# Patient Record
Sex: Female | Born: 1975
Health system: Southern US, Community
[De-identification: ages and names within clinical notes are randomized; demographics above are authoritative.]

## PROBLEM LIST (undated history)

## (undated) DIAGNOSIS — N6012 Diffuse cystic mastopathy of left breast: Secondary | ICD-10-CM

## (undated) DIAGNOSIS — N63 Unspecified lump in unspecified breast: Secondary | ICD-10-CM

## (undated) DIAGNOSIS — N6011 Diffuse cystic mastopathy of right breast: Secondary | ICD-10-CM

## (undated) HISTORY — DX: Diffuse cystic mastopathy of right breast: N60.12

## (undated) HISTORY — DX: Diffuse cystic mastopathy of right breast: N60.11

## (undated) HISTORY — PX: BREAST CYST ASPIRATION: SHX578

## (undated) HISTORY — DX: Unspecified lump in unspecified breast: N63.0

---

## 2005-02-02 ENCOUNTER — Observation Stay: Payer: Self-pay | Admitting: Obstetrics and Gynecology

## 2005-02-19 ENCOUNTER — Inpatient Hospital Stay: Payer: Self-pay

## 2005-02-26 ENCOUNTER — Observation Stay: Payer: Self-pay

## 2005-03-19 ENCOUNTER — Observation Stay: Payer: Self-pay | Admitting: Obstetrics and Gynecology

## 2005-04-23 ENCOUNTER — Observation Stay: Payer: Self-pay

## 2005-04-28 ENCOUNTER — Observation Stay: Payer: Self-pay

## 2005-05-04 ENCOUNTER — Inpatient Hospital Stay: Payer: Self-pay | Admitting: Obstetrics and Gynecology

## 2006-12-25 ENCOUNTER — Observation Stay: Payer: Self-pay

## 2007-06-26 ENCOUNTER — Emergency Department: Payer: Self-pay | Admitting: Emergency Medicine

## 2009-05-20 ENCOUNTER — Ambulatory Visit: Payer: Self-pay

## 2010-06-29 ENCOUNTER — Ambulatory Visit: Payer: Self-pay | Admitting: General Practice

## 2010-12-20 ENCOUNTER — Ambulatory Visit: Payer: Self-pay | Admitting: Unknown Physician Specialty

## 2011-03-06 DIAGNOSIS — N63 Unspecified lump in unspecified breast: Secondary | ICD-10-CM

## 2011-03-06 HISTORY — DX: Unspecified lump in unspecified breast: N63.0

## 2011-12-04 HISTORY — PX: BREAST BIOPSY: SHX20

## 2011-12-20 ENCOUNTER — Ambulatory Visit: Payer: Self-pay | Admitting: Obstetrics and Gynecology

## 2012-02-12 ENCOUNTER — Ambulatory Visit: Payer: Self-pay | Admitting: General Practice

## 2012-10-13 DIAGNOSIS — D239 Other benign neoplasm of skin, unspecified: Secondary | ICD-10-CM

## 2012-10-13 HISTORY — DX: Other benign neoplasm of skin, unspecified: D23.9

## 2013-02-09 ENCOUNTER — Ambulatory Visit: Payer: Self-pay | Admitting: Obstetrics and Gynecology

## 2013-06-17 ENCOUNTER — Encounter: Payer: Self-pay | Admitting: General Surgery

## 2013-07-09 ENCOUNTER — Ambulatory Visit (INDEPENDENT_AMBULATORY_CARE_PROVIDER_SITE_OTHER): Payer: BC Managed Care – PPO | Admitting: General Surgery

## 2013-07-09 ENCOUNTER — Encounter: Payer: Self-pay | Admitting: General Surgery

## 2013-07-09 ENCOUNTER — Other Ambulatory Visit: Payer: BC Managed Care – PPO

## 2013-07-09 VITALS — BP 116/70 | HR 74 | Resp 12 | Ht 71.0 in | Wt 141.0 lb

## 2013-07-09 DIAGNOSIS — N63 Unspecified lump in unspecified breast: Secondary | ICD-10-CM

## 2013-07-09 DIAGNOSIS — N6009 Solitary cyst of unspecified breast: Secondary | ICD-10-CM

## 2013-07-09 NOTE — Patient Instructions (Signed)
Continue self breast exams. Call office for any new breast issues or concerns. 

## 2013-07-09 NOTE — Progress Notes (Addendum)
Patient ID: Tonya Leonard, female   DOB: 08/07/1975, 38 y.o.   MRN: [id number]  Chief Complaint  Patient presents with  . Follow-up    Right breast nodule    HPI Tonya Leonard is a 38 y.o. female who presents for an evaluation of a right breast nodule. Her most recent mammogram was done in 02/09/13. She noticed a right breast nodule approximately 2 months ago that is below the previous biopsy site. It has not changed in size. No pain or discharge. She checks her breasts on a monthly basis and gets regular mammograms. The left breast just "feels different" but no identifiable mass.She has a family history of breast cancer on her maternal side.  Breast fed twins 2007 and she appreciated a significant decrease in her breast size since that time.  Because of the family history, her mother reportedly underwent genetic testing and was found not to be a BRCA carrier.  The patient's mother, [name], had brought an extended family tree to the office in 2012: This documented multiple generations of breast, ovarian, colon and esophageal cancer. Ms. Hightower mother had tested BRCA negative. Her mother is family history dating back to the maternal great-grandfather is positive only for breast cancer.  The patient's aunts:  [name] had breast cancer but was BRCA negative,  [name] was BRCA positive. Her maternal grandfather had bilateral breast cancer.     ]  HPI  Past Medical History  Diagnosis Date  . Breast mass 2013    right breast    Past Surgical History  Procedure Laterality Date  . Breast biopsy Right 12/2011    Family History  Problem Relation Age of Onset  . Cancer Maternal Aunt 60    breast  . Cancer Maternal Aunt 43    breast  . Cancer Maternal Grandfather 107    breast     Social History History  Substance Use Topics  . Smoking status: Never Smoker   . Smokeless tobacco: Not on file  . Alcohol Use: Yes    No Known Allergies  No current outpatient prescriptions  on file.   No current facility-administered medications for this visit.    Review of Systems Review of Systems  Constitutional: Negative.   Respiratory: Negative.   Cardiovascular: Negative.     Blood pressure 116/70, pulse 74, resp. rate 12, height _0  (1.803 m), weight 141 lb (63.957 kg), last menstrual period 06/25/2013.  Physical Exam Physical Exam  Constitutional: She is oriented to person, place, and time. She appears well-developed and well-nourished.  Eyes: Conjunctivae are normal.  Neck: Neck supple.  Cardiovascular: Normal rate, regular rhythm and normal heart sounds.   Pulmonary/Chest: Effort normal and breath sounds normal. Right breast exhibits mass. Right breast exhibits no inverted nipple, no nipple discharge, no skin change and no tenderness. Left breast exhibits no inverted nipple, no nipple discharge, no skin change and no tenderness.    Right breast exam: 1 cm nodule 6 CFN right breast at the 1 o'clock position. Left breast exam: 2 o'clock addition, 8 CFN identified a 2 cm soft, nontender mass. At the 4 o'clock position, 6 CFN base bar a focal area of thickening was appreciated.   Lymphadenopathy:    She has no cervical adenopathy.  Neurological: She is alert and oriented to person, place, and time.  Skin: Skin is warm and dry.    Data Reviewed Bilateral mammograms dated 02/09/2013 showed extremely dense breast. No mammographic abnormality appreciated. BI-RAD-1. These films were  reviewed.  01/01/2012 biopsy completed due do an atypical finding in the right breast showed evidence of benign breast tissue with changes of a ruptured cyst. Pseudo-angiomatous stromal hyperplasia with sclerosing adenosis was appreciated. No evidence of atypia or malignancy.  With palpable findings in both breasts bilateral ultrasound was completed.  Left breast ultrasound showed a dominant mass measuring 1.6 x 3.3 x 3.9 cm at the 2:00 position, 6 cm from the nipple. Smooth  margins, posterior acoustic enhancement and Brownian motion was noted consistent with a simple cyst. Adjacent to this were several "daughter cysts" measuring 0.7 and 1.0 cm respectively. At the 12:00 position the left breast, 5 cm from the nipple a softly lobulated cystic lesion with smooth margins was identified measuring 0.8 x 1.8 x 2.46 cm. This rested on the pectoralis fashion making assessment for posterior acoustic enhancement difficult. At the 4:00 position of the left breast to 0.4 x 0.4 x 0.5 cm simple cyst was identified 6 cm from the nipple. At the 6:00 position of the left breast, 6 cm from the nipple multiple cysts were identified measuring up to 0.9 cm in diameter.  In the right breast multiple lesions were identified. At the 4:00 position, 6 cm in level is 0.7 x 1.0 x 1.13 cm simple cyst was identified. At the 10:00 position a 0.75 x 0.86 x 0.95 cm cyst was noted with several adjacent smaller lesions. At the 1:00 position 3 cm from the nipple a 0.9 x 1.6 x 1.86 cm simple cyst was noted. All of these lesion showed good posterior acoustic enhancement, smooth borders and sharp edge affect. BI-RAD-2.  Assessment    Multiple breast cysts.    Plan    Options for management of multiple breast cysts were discussed. The patient may or may not be sensitive to caffeine. Caffeine cessation may improve her overall breast texture. She is not having significant pain, making it likely that the caffeine withdrawal may be more aggravating than the multiple cysts on her clinical exam. The potential role for antioxidant vitamins such as Protegra and Ocuvite were discussed.  The patient's Baker Janus model breast cancer risk is estimated is 1.0% for 5 years, 14.8% lifetime.  IBIS model shows a 10 year risk of 2.5%, lifetime risk of 20%.  (Neither model takes into account the paternal grandfather's history). In light of the patient's mother being BRCA tested and having a negative result reported, I don't see strong  indication for the patient herself to undergo BRCA testing. By Ronelle Nigh testing she is not a candidate for breast MRI, and even making use of the [place], she barely crosses the threshold. I would not recommend breast MRI at this time.    With the identification of only simple cysts in the breast, the patient has been encouraged to continue monthly self-examination and to report any interval change for reassessment.       PCP: Tamera Stands Mell Mellott 07/12/2013, 12:17 PM

## 2013-07-12 DIAGNOSIS — N6009 Solitary cyst of unspecified breast: Secondary | ICD-10-CM | POA: Insufficient documentation

## 2013-07-12 DIAGNOSIS — N63 Unspecified lump in unspecified breast: Secondary | ICD-10-CM | POA: Insufficient documentation

## 2013-07-14 ENCOUNTER — Telehealth: Payer: Self-pay | Admitting: *Deleted

## 2013-07-14 NOTE — Telephone Encounter (Signed)
duplicate

## 2013-07-14 NOTE — Telephone Encounter (Signed)
Notified patient as instructed, patient pleased. Discussed follow-up appointments, patient agrees. She has stopped caffeine intake to see if that makes a difference, headaches discussed. She will continue to do self breast examinations and call if new issues rise.

## 2013-07-14 NOTE — Telephone Encounter (Signed)
Message copied by Carson Myrtle on Tue Jul 14, 2013  8:28 AM ------      Message from: [place], [place]      Created: Sun Jul 12, 2013 12:47 PM       Please notify the patient look through her mother's family pedigree, and at this time don't find any additional testing as appropriate. Reassure her that should she appreciate any future changes on her breasts she be welcome to return for reassessment. ------

## 2013-07-17 ENCOUNTER — Encounter: Payer: Self-pay | Admitting: General Surgery

## 2013-10-07 ENCOUNTER — Ambulatory Visit: Payer: Self-pay | Admitting: General Surgery

## 2013-11-16 ENCOUNTER — Telehealth: Payer: Self-pay

## 2013-11-16 NOTE — Telephone Encounter (Signed)
Patient called stating that she had found a new area on her left breast. She states that she normally has multiple cysts but this area feels larger. She states that she just started her menses and that the area is not painful. She also states that it seems like it may have moved a little bit from when she first noticed it. She is concerned and would like to speak with someone further about this. She is scheduled for a follow up her on 11/25/13 but is not sure if this is necessary.

## 2013-11-17 NOTE — Telephone Encounter (Signed)
She denies pain, "ping ball" in size. She states it "feels different". Heating pad as needed. She did start drinking more caffeine but has stopped it again. I encouraged her to keep her appointment for next week, not to try not manipulate the area too much, she agrees.

## 2013-11-25 ENCOUNTER — Encounter: Payer: Self-pay | Admitting: General Surgery

## 2013-11-25 ENCOUNTER — Ambulatory Visit (INDEPENDENT_AMBULATORY_CARE_PROVIDER_SITE_OTHER): Payer: BC Managed Care – PPO | Admitting: General Surgery

## 2013-11-25 ENCOUNTER — Other Ambulatory Visit: Payer: BC Managed Care – PPO

## 2013-11-25 VITALS — BP 118/76 | HR 82 | Resp 12 | Ht 71.0 in | Wt 126.0 lb

## 2013-11-25 DIAGNOSIS — N6009 Solitary cyst of unspecified breast: Secondary | ICD-10-CM

## 2013-11-25 DIAGNOSIS — N6002 Solitary cyst of left breast: Secondary | ICD-10-CM

## 2013-11-25 DIAGNOSIS — N63 Unspecified lump in unspecified breast: Secondary | ICD-10-CM

## 2013-11-25 NOTE — Patient Instructions (Signed)
Continue self breast exams. Call office for any new breast issues or concerns. 

## 2013-11-25 NOTE — Progress Notes (Signed)
Patient ID: Tonya Leonard, female   DOB: 1976-02-15, 38 y.o.   MRN: [id number]  Chief Complaint  Patient presents with  . Other    new left breast mass    HPI Tonya Leonard is a 38 y.o. female here today for a evaluation of a new left breast mass. Patient noticed this about 2 weeks ago. She denies pain, "ping ball" in size. She states it "feels different".  She did start drinking more caffeine but has stopped it again. She does state that her menstrual periods seem to be getting worse each year.   HPI  Past Medical History  Diagnosis Date  . Breast mass 2013    right breast    Past Surgical History  Procedure Laterality Date  . Breast biopsy Right 12/2011    Family History  Problem Relation Age of Onset  . Cancer Maternal Aunt 60    breast  . Cancer Maternal Aunt 43    breast  . Cancer Maternal Grandfather 85    breast     Social History History  Substance Use Topics  . Smoking status: Never Smoker   . Smokeless tobacco: Not on file  . Alcohol Use: Yes    No Known Allergies  No current outpatient prescriptions on file.   No current facility-administered medications for this visit.    Review of Systems Review of Systems  Constitutional: Negative.   Respiratory: Negative.   Cardiovascular: Negative.     Blood pressure 118/76, pulse 82, resp. rate 12, height 5\' 11"  (1.803 m), weight 126 lb (57.153 kg), last menstrual period 11/19/2013.  Physical Exam Physical Exam  Constitutional: She is oriented to person, place, and time. She appears well-developed and well-nourished.  Neck: Neck supple.  Cardiovascular: Normal rate, regular rhythm and normal heart sounds.   Pulmonary/Chest: Effort normal and breath sounds normal. Right breast exhibits mass. Right breast exhibits no inverted nipple, no nipple discharge, no skin change and no tenderness. Left breast exhibits mass. Left breast exhibits no inverted nipple, no nipple discharge, no skin change and no  tenderness.    Right breast exam again shows a 1 cm nodule at the 4:00 position, 5 cm from the nipple.   Left breast exam shows a dominant mass in the upper outer quadrant the 2:00 position. This measures 3-4 cm in diameter.  Lymphadenopathy:    She has no cervical adenopathy.    She has no axillary adenopathy.  Neurological: She is alert and oriented to person, place, and time.  Skin: Skin is warm and dry.    Data Reviewed The may 2015 ultrasound showed a nearly 4 cm cyst in the upper-outer quadrant of the left breast.  Ultrasound examination today shows the dominant cyst with evidence of Brownian motion within the cyst fluid. Good acoustic enhancement was noted. The area measured  2.14 x 2.5 x 3.23 cm. Immediately adjacent to this is a smaller 0.9 x 0.9 cm simple cyst. Due to the discomfort the patient has experienced in the last 2 weeks, aspiration was encouraged and accepted. Using 1 cc of 1% plain Xylocaine the area was aspirated with complete resolution. 10 cc of air was then instilled to minimize the risk of recurrence. The procedure was well tolerated. A palpable defect was notable after cyst aspiration. BI-RAD-2.  Assessment    Symptomatic left breast cyst, resolved on aspiration.    Plan    The patient was incredibly anxious throughout today's exam until the cyst was aspirated. With complete  resolution on aspiration and comfortable that a repeat clinical exam is not mandatory. This was offered to the patient however in the event that she would feel more comfortable. At this time, she'll continue regular self examinations and report if she appreciates any change in the breast. The small asymptomatic 1 cm cyst in the right breast did not require intervention. (Previous ultrasound confirmed this to be a simple cyst)    PCP: Jayme Cloud 11/26/2013, 4:14 PM

## 2014-01-04 ENCOUNTER — Encounter: Payer: Self-pay | Admitting: General Surgery

## 2014-01-05 LAB — TSH: TSH: 1.59 u[IU]/mL (ref <=5.90)

## 2014-01-05 LAB — LIPID PANEL
CHOLESTEROL: 174 mg/dL (ref 0–200)
HDL: 56 mg/dL (ref 35–70)
LDL Cholesterol: 107 mg/dL
Triglycerides: 54 mg/dL (ref 40–160)

## 2014-01-05 LAB — BASIC METABOLIC PANEL: GLUCOSE: 94 mg/dL

## 2014-01-15 LAB — HM PAP SMEAR: HM PAP: NEGATIVE

## 2014-05-27 ENCOUNTER — Ambulatory Visit: Payer: Self-pay | Admitting: Obstetrics and Gynecology

## 2014-06-02 ENCOUNTER — Telehealth: Payer: Self-pay

## 2014-06-02 NOTE — Telephone Encounter (Signed)
Patient called stating that she just had her screening mammogram done at [facility] on 05/20/14. She said that they found an area on her right breast and she would like for you to review her mammogram and see if she needs to be see or not or if she should have further imaging done. She states that she is not having any problems with her breast but does have a long history of breast cysts.

## 2014-06-04 NOTE — Telephone Encounter (Signed)
Patient's recent mammogram suggested an asymmetry in the lower inner quadrant of the right breast. The patient was identified with a cyst in this area at the time of her spring 2015 exam. Ultrasound would be appropriate to confirm that this is indeed the same lesion. The patient can have this completed through her primary care provider and [facility] or can return here for assessment. She was encouraged to call the office on 06/07/2014 (Monday) if we can be of assistance.

## 2014-06-05 ENCOUNTER — Telehealth: Payer: Self-pay | Admitting: General Surgery

## 2014-06-05 NOTE — Telephone Encounter (Signed)
The patient called the office on 06/02/2014 requesting that I review her most recent mammograms as a asymmetry was identified on screening study of March 24. The patient reports being told by the [facility]'s staff/M.D. that they needed to "clear her breast" and that coming back to this office where she has been seen for over a year was "backwards".  I reviewed her previous exams and ultrasounds, and the density presently identified in the lower inner quadrant of the right breast is most likely a cyst.  She's been a contact the office on Monday, 06/07/2014 and make arrangements for visit and right breast ultrasound.

## 2014-06-15 ENCOUNTER — Ambulatory Visit
Admit: 2014-06-15 | Disposition: A | Discharge status: Home / Self Care | Payer: Self-pay | Attending: Obstetrics and Gynecology | Admitting: Obstetrics and Gynecology

## 2014-06-16 ENCOUNTER — Ambulatory Visit: Payer: Self-pay | Admitting: General Surgery

## 2014-09-08 IMAGING — US ULTRASOUND RIGHT BREAST
1 series · 13 of 25 positions shown · non-contrast
Comparison: 12/20/2010, 05/20/2009.

REASON FOR EXAM: RT BRST MAS 3 OCLOCK AND YRLY
COMMENTS:

PROCEDURE:     US  - US BREAST RIGHT  - December 20, 2011  [DATE]
RESULT:

[Series 1: ultrasound right breast · 0.08mm/px · 13 of 26 slices shown]
[im 1/26]
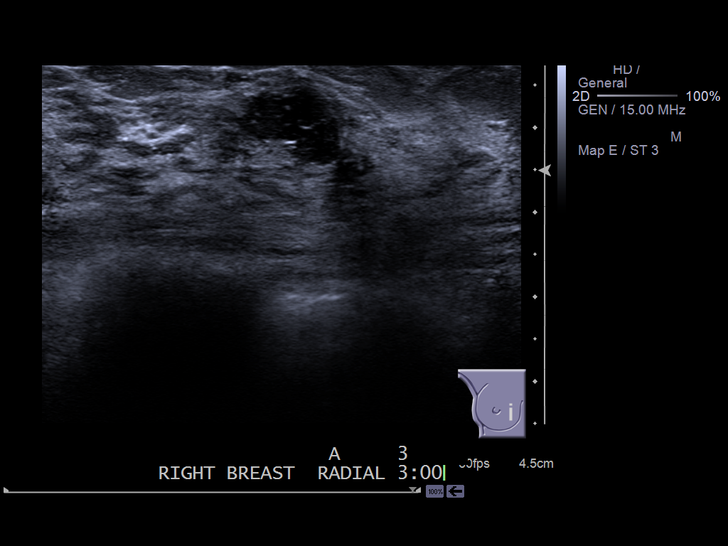
[im 3/26]
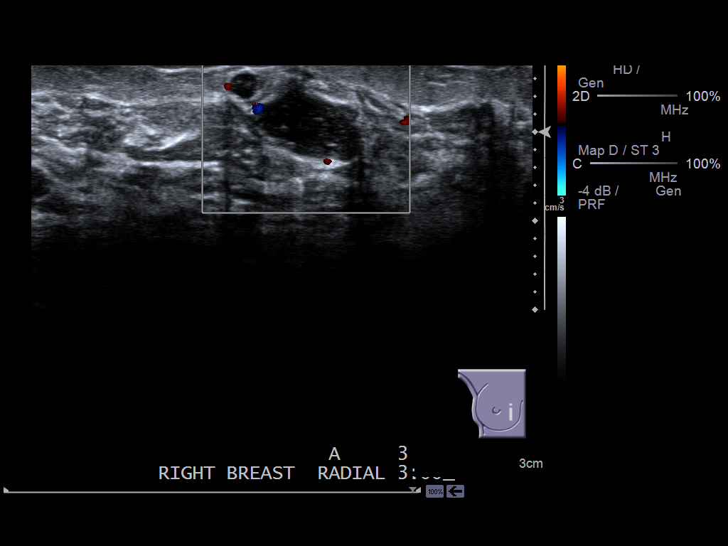
[im 5/26]
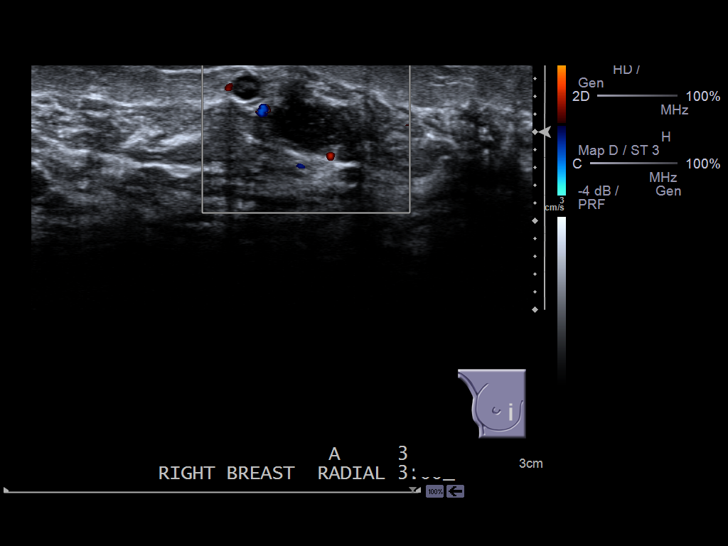
[im 7/26]
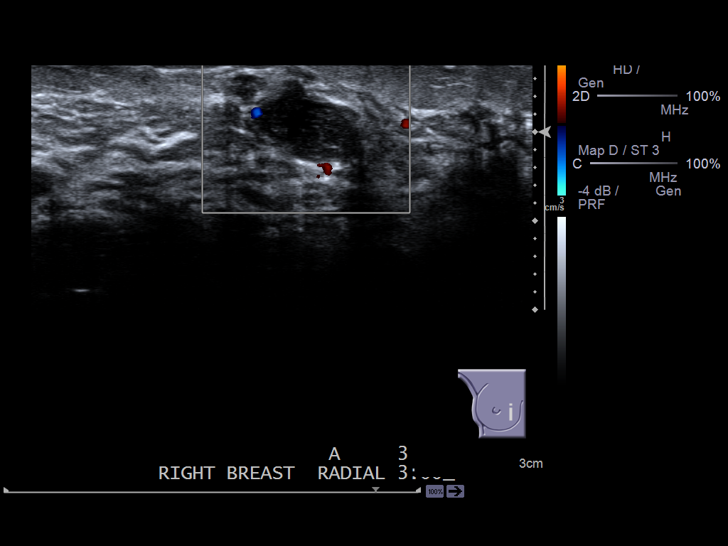
[im 9/26]
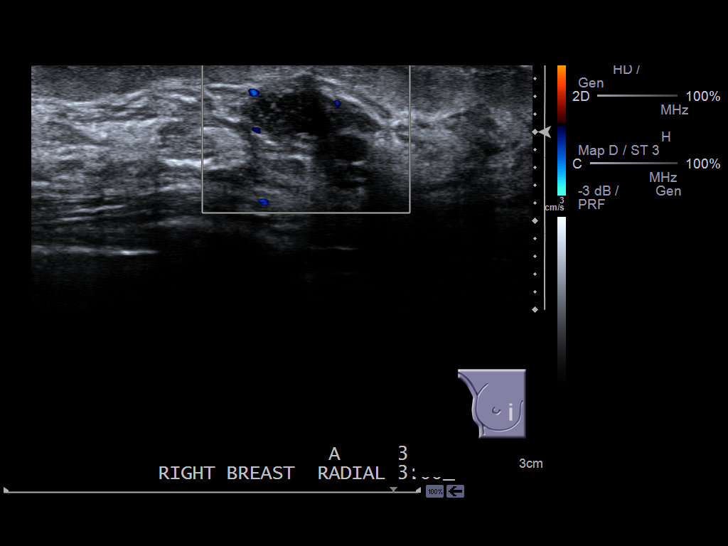
[im 11/26]
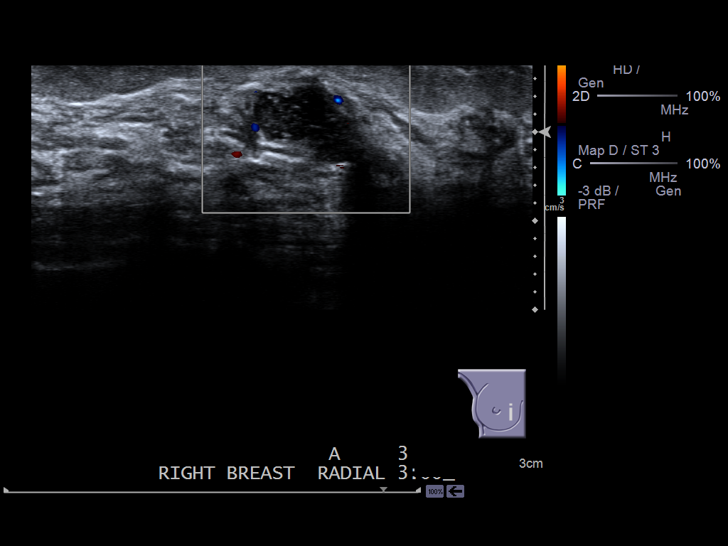
[im 13/26]
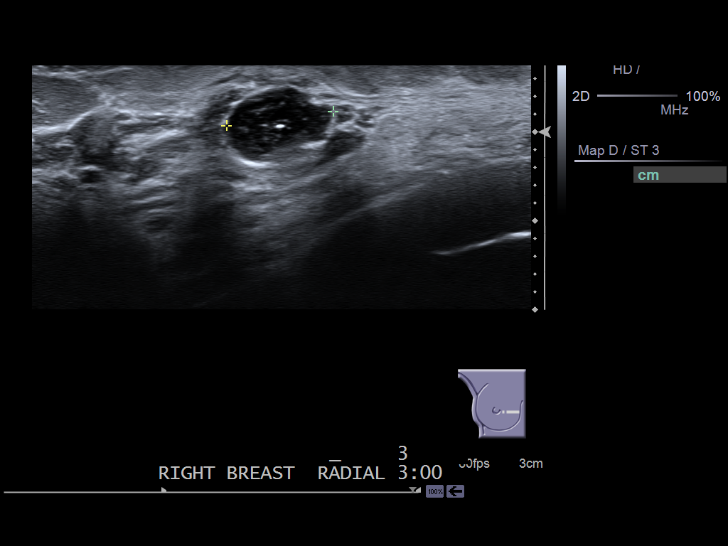
[im 15/26]
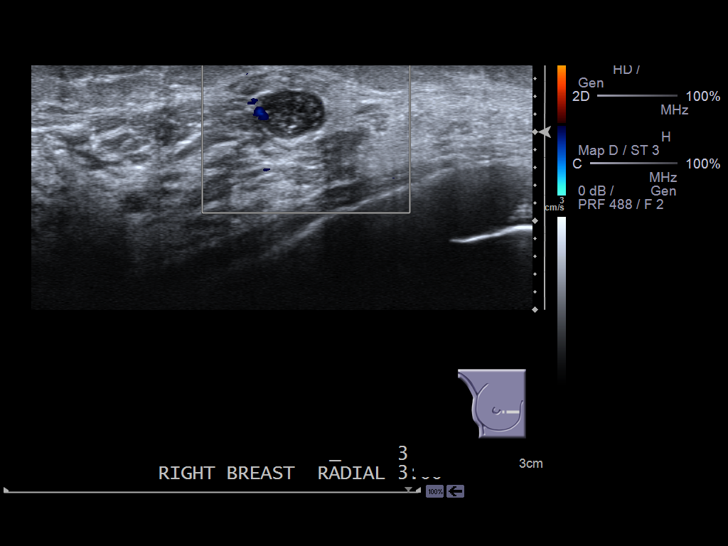
[im 17/26]
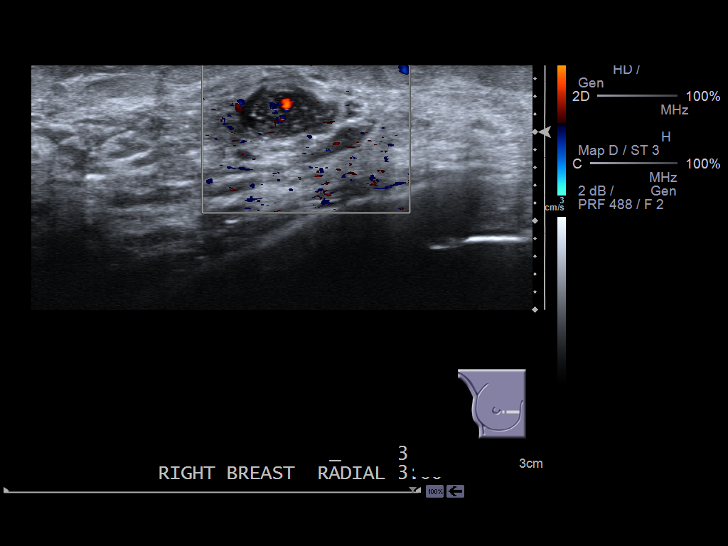
[im 19/26]
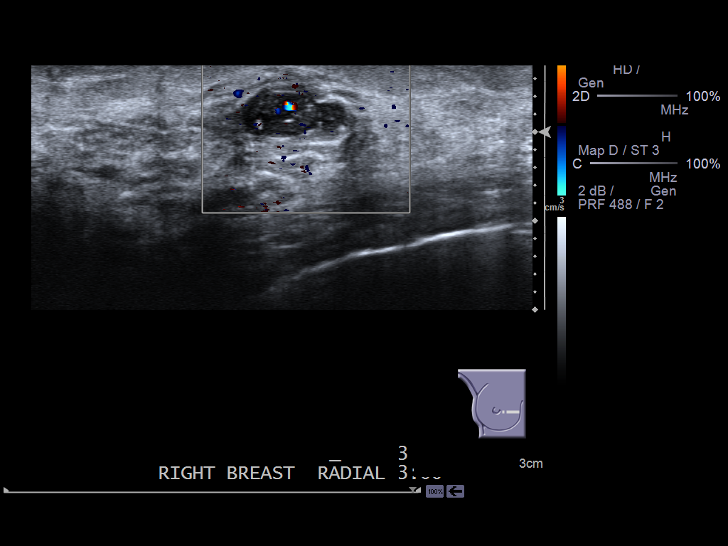
[im 21/26]
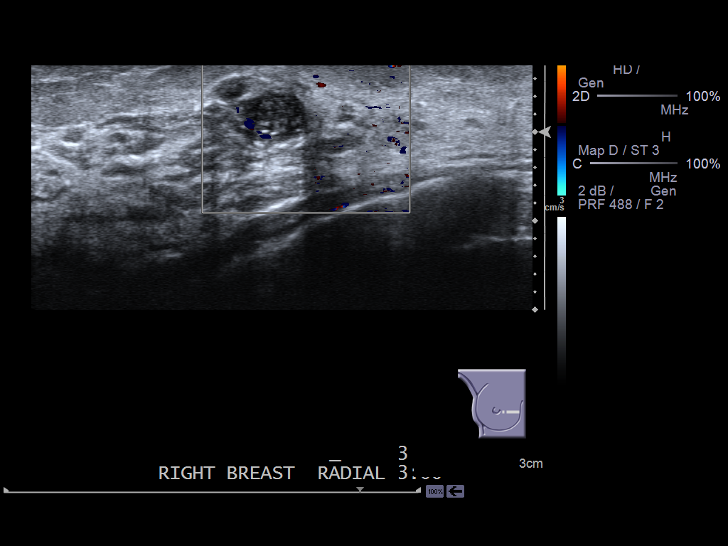
[im 23/26]
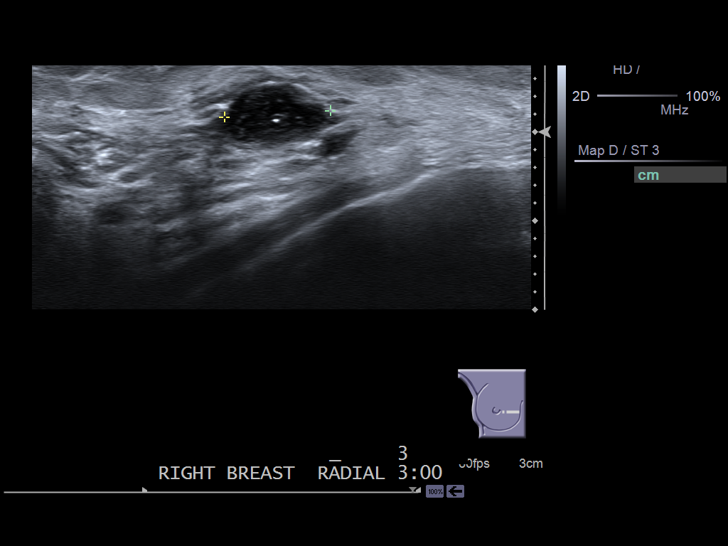
[im 26/26]
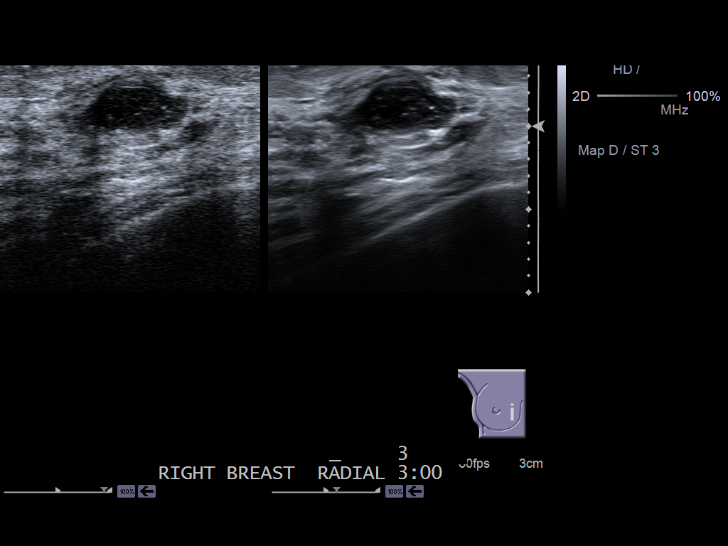

[13 of 25 positions shown; findings below may reference images not displayed]

FINDINGS: The breast tissue is extremely dense, which lowers the sensitivity of
mammography. No suspicious masses or calcifications are identified. Spot
compression magnification views are performed of the medial right breast at
approximately 3 o'clock secondary to reported palpable abnormality at this
site. There is dense fibroglandular tissue in this region. However, no
definite mass or focal asymmetry is identified.

Real-time ultrasound was performed of the right breast at 3 o'clock
secondary to reported palpable abnormality at this site. There is a
hypoechoic mass which measures 1.3 x 1.2 x 0.6 cm. There is internal color
Doppler flow. Posterior acoustic enhancement is noted on some of these
images. Some of the borders are irregular. There may be a small,
subcentimeter, hypoechoic mass or lobulation immediately adjacent to the
mass on a few of the images.
IMPRESSION: 1.     BI-RADS: Category 4 - Suspicious Abnormality.
2.     Surgical consultation and tissue diagnosis are recommended for the
suspicious mass in the right breast at 3 o'clock, which was seen by
ultrasound only.

This was discussed with the patient in person immediately after the
ultrasound examination.

## 2015-01-05 ENCOUNTER — Encounter: Payer: Self-pay | Admitting: *Deleted

## 2015-01-07 ENCOUNTER — Encounter: Payer: Self-pay | Admitting: Obstetrics and Gynecology

## 2015-01-11 ENCOUNTER — Encounter: Payer: Self-pay | Admitting: Obstetrics and Gynecology

## 2015-01-11 ENCOUNTER — Ambulatory Visit (INDEPENDENT_AMBULATORY_CARE_PROVIDER_SITE_OTHER): Payer: BLUE CROSS/BLUE SHIELD | Admitting: Obstetrics and Gynecology

## 2015-01-11 VITALS — BP 101/73 | HR 109 | Ht 70.0 in | Wt 128.5 lb

## 2015-01-11 DIAGNOSIS — Z803 Family history of malignant neoplasm of breast: Secondary | ICD-10-CM

## 2015-01-11 DIAGNOSIS — N6009 Solitary cyst of unspecified breast: Secondary | ICD-10-CM | POA: Diagnosis not present

## 2015-01-11 DIAGNOSIS — Z01419 Encounter for gynecological examination (general) (routine) without abnormal findings: Secondary | ICD-10-CM | POA: Diagnosis not present

## 2015-01-11 DIAGNOSIS — N920 Excessive and frequent menstruation with regular cycle: Secondary | ICD-10-CM | POA: Diagnosis not present

## 2015-01-11 MED ORDER — INFLUENZA VAC SPLIT QUAD 0.5 ML IM SUSY
0.5000 mL | PREFILLED_SYRINGE | Freq: Once | INTRAMUSCULAR | Status: AC
  Administered 2015-01-11: 0.5 mL via INTRAMUSCULAR

## 2015-01-11 NOTE — Progress Notes (Signed)
Patient ID: Tonya Leonard, female   DOB: 04-01-1975, 39 y.o.   MRN: [id number] Subjective:   Tonya Leonard is a 39 y.o. G66P3 Caucasian female here for a routine well-woman exam.  Patient's last menstrual period was 12/22/2014.    Current complaints: fibrocystic breast still painful before menses PCP: NA       Does need &  desire labs  Social History: Sexual: heterosexual Marital Status: married Living situation: with family Occupation: homemaker Tobacco/alcohol: no alcohol use Illicit drugs: no history of illicit drug use  The following portions of the patient's history were reviewed and updated as appropriate: allergies, current medications, past family history, past medical history, past social history, past surgical history and problem list.  Past Medical History Past Medical History  Diagnosis Date  . Breast mass 2013    right breast    Past Surgical History Past Surgical History  Procedure Laterality Date  . Breast biopsy Right 12/2011    Gynecologic History G3P3  Patient's last menstrual period was 12/22/2014. Contraception: vasectomy Last Pap: 2015. Results were: normal Last mammogram: 2015. Results were: abnormal with multiple small bilateral cysts  Obstetric History OB History  Gravida Para Term Preterm AB SAB TAB Ectopic Multiple Living  3 3       1 3     # Outcome Date GA Lbr Len/2nd Weight Sex Delivery Anes PTL Lv  3 Para           2 Para           1 Para             Obstetric Comments  1st Menstrual Cycle:  10  1st Pregnancy:  24  1 child still born at 20 weeks  1 set of twins    Current Medications Current Outpatient Prescriptions on File Prior to Visit  Medication Sig Dispense Refill  . ergocalciferol (VITAMIN D2) 50000 UNITS capsule Take 50,000 Units by mouth once a week.    . tranexamic acid (LYSTEDA) 650 MG TABS tablet Take 650 mg by mouth 3 (three) times daily.     No current facility-administered medications on file prior to visit.     Review of Systems Patient denies any headaches, blurred vision, shortness of breath, chest pain, abdominal pain, problems with bowel movements, urination, or intercourse.  Objective:  BP 101/73 mmHg  Pulse 109  Ht 5\' 10"  (1.778 m)  Wt 128 lb 8 oz (58.287 kg)  BMI 18.44 kg/m2  LMP 12/22/2014 Physical Exam  General:  Well developed, well nourished, no acute distress. She is alert and oriented x3. Skin:  Warm and dry Neck:  Midline trachea, no thyromegaly or nodules Cardiovascular: Regular rate and rhythm, no murmur heard Lungs:  Effort normal, all lung fields clear to auscultation bilaterally Breasts:  No dominant palpable mass, retraction, or nipple discharge Abdomen:  Soft, non tender, no hepatosplenomegaly or masses Pelvic:  External genitalia is normal in appearance.  The vagina is normal in appearance. The cervix is bulbous, no CMT.  Thin prep pap is not done. Uterus is felt to be normal size, shape, and contour.  No adnexal masses or tenderness noted. Extremities:  No swelling or varicosities noted Psych:  She has a normal mood and affect  Assessment:   Healthy well-woman exam Bilateral breast cysts Menorrhagia with regular cycles Vitamin D deficiency  Plan:  Routine screening labs obtained F/U 1 year for AE, or sooner if needed Mammogram changed to MRI due to family history and multiple  breast cysts   Reshawn Ostlund Valene Bors, CNM

## 2015-01-11 NOTE — Patient Instructions (Addendum)
Place annual gynecologic exam patient instructions here.  Thank you for enrolling in [facility]. Please follow the instructions below to securely access your online medical record. MyChart allows you to send messages to your doctor, view your test results, manage appointments, and more.   How Do I Sign Up? 1. In your Internet browser, go to [employer] and enter [email or web address]. 2. Click on the Sign Up Now link in the Sign In box. You will see the New Member Sign Up page. 3. Enter your MyChart Access Code exactly as it appears below. You will not need to use this code after you've completed the sign-up process. If you do not sign up before the expiration date, you must request a new code.  MyChart Access Code: [email or web address] Expires: 03/12/2015 10:49 AM  4. Enter your Social Security Number ([id number]) and Date of Birth (mm/dd/yyyy) as indicated and click Submit. You will be taken to the next sign-up page. 5. Create a MyChart ID. This will be your MyChart login ID and cannot be changed, so think of one that is secure and easy to remember. 6. Create a MyChart password. You can change your password at any time. 7. Enter your Password Reset Question and Answer. This can be used at a later time if you forget your password.  8. Enter your e-mail address. You will receive e-mail notification when new information is available in [facility]. 9. Click Sign Up. You can now view your medical record.   Additional Information Remember, MyChart is NOT to be used for urgent needs. For medical emergencies, dial 911.   Influenza (Flu) Vaccine (Inactivated or Recombinant):  1. Why get vaccinated? Influenza ("flu") is a contagious disease that spreads around the [place] every year, usually between October and May. Flu is caused by influenza viruses, and is spread mainly by coughing, sneezing, and close contact. Anyone can get flu. Flu strikes suddenly and can last several days.  Symptoms vary by age, but can include:  fever/chills  sore throat  muscle aches  fatigue  cough  headache  runny or stuffy nose Flu can also lead to pneumonia and blood infections, and cause diarrhea and seizures in children. If you have a medical condition, such as heart or lung disease, flu can make it worse. Flu is more dangerous for some people. Infants and young children, people 84 years of age and older, pregnant women, and people with certain health conditions or a weakened immune system are at greatest risk. Each year thousands of people in the [place] die from flu, and many more are hospitalized. Flu vaccine can:  keep you from getting flu,  make flu less severe if you do get it, and  keep you from spreading flu to your family and other people. 2. Inactivated and recombinant flu vaccines A dose of flu vaccine is recommended every flu season. Children 6 months through 54 years of age may need two doses during the same flu season. Everyone else needs only one dose each flu season. Some inactivated flu vaccines contain a very small amount of a mercury-based preservative called thimerosal. Studies have not shown thimerosal in vaccines to be harmful, but flu vaccines that do not contain thimerosal are available. There is no live flu virus in flu shots. They cannot cause the flu. There are many flu viruses, and they are always changing. Each year a new flu vaccine is made to protect against three or four viruses that are likely to cause disease in the upcoming  flu season. But even when the vaccine doesn't exactly match these viruses, it may still provide some protection. Flu vaccine cannot prevent:  flu that is caused by a virus not covered by the vaccine, or  illnesses that look like flu but are not. It takes about 2 weeks for protection to develop after vaccination, and protection lasts through the flu season. 3. Some people should not get this vaccine Tell the person  who is giving you the vaccine:  If you have any severe, life-threatening allergies. If you ever had a life-threatening allergic reaction after a dose of flu vaccine, or have a severe allergy to any part of this vaccine, you may be advised not to get vaccinated. Most, but not all, types of flu vaccine contain a small amount of egg protein.  If you ever had Guillain-Barre Syndrome (also called GBS). Some people with a history of GBS should not get this vaccine. This should be discussed with your doctor.  If you are not feeling well. It is usually okay to get flu vaccine when you have a mild illness, but you might be asked to come back when you feel better. 4. Risks of a vaccine reaction With any medicine, including vaccines, there is a chance of reactions. These are usually mild and go away on their own, but serious reactions are also possible. Most people who get a flu shot do not have any problems with it. Minor problems following a flu shot include:  soreness, redness, or swelling where the shot was given  hoarseness  sore, red or itchy eyes  cough  fever  aches  headache  itching  fatigue If these problems occur, they usually begin soon after the shot and last 1 or 2 days. More serious problems following a flu shot can include the following:  There may be a small increased risk of Guillain-Barre Syndrome (GBS) after inactivated flu vaccine. This risk has been estimated at 1 or 2 additional cases per million people vaccinated. This is much lower than the risk of severe complications from flu, which can be prevented by flu vaccine.  Young children who get the flu shot along with pneumococcal vaccine (PCV13) and/or DTaP vaccine at the same time might be slightly more likely to have a seizure caused by fever. Ask your doctor for more information. Tell your doctor if a child who is getting flu vaccine has ever had a seizure. Problems that could happen after any injected  vaccine:  People sometimes faint after a medical procedure, including vaccination. Sitting or lying down for about 15 minutes can help prevent fainting, and injuries caused by a fall. Tell your doctor if you feel dizzy, or have vision changes or ringing in the ears.  Some people get severe pain in the shoulder and have difficulty moving the arm where a shot was given. This happens very rarely.  Any medication can cause a severe allergic reaction. Such reactions from a vaccine are very rare, estimated at about 1 in a million doses, and would happen within a few minutes to a few hours after the vaccination. As with any medicine, there is a very remote chance of a vaccine causing a serious injury or death. The safety of vaccines is always being monitored. For more information, visit: http://www.aguilar.org/ 5. What if there is a serious reaction? What should I look for?  Look for anything that concerns you, such as signs of a severe allergic reaction, very high fever, or unusual behavior. Signs of a  severe allergic reaction can include hives, swelling of the face and throat, difficulty breathing, a fast heartbeat, dizziness, and weakness. These would start a few minutes to a few hours after the vaccination. What should I do?  If you think it is a severe allergic reaction or other emergency that can't wait, call 9-1-1 and get the person to the nearest hospital. Otherwise, call your doctor.  Reactions should be reported to the Vaccine Adverse Event Reporting System (VAERS). Your doctor should file this report, or you can do it yourself through the VAERS web site at www.vaers.SamedayNews.es, or by calling 819-123-6438. VAERS does not give medical advice. 6. The National Vaccine Injury Compensation Program The [employer] Vaccine Injury Compensation Program (VICP) is a federal program that was created to compensate people who may have been injured by certain vaccines. Persons who believe they may have been  injured by a vaccine can learn about the program and about filing a claim by calling (732) 740-2151 or visiting the [place] website at GoldCloset.com.ee. There is a time limit to file a claim for compensation. 7. How can I learn more?  Ask your healthcare provider. He or she can give you the vaccine package insert or suggest other sources of information.  Call your local or state health department.  Contact the Centers for Disease Control and Prevention (CDC):  Call 442-587-1020 (1-800-CDC-INFO) or  Visit CDC's website at https://gibson.com/ Vaccine Information Statement Inactivated Influenza Vaccine (10/09/2013)   This information is not intended to replace advice given to you by your health care provider. Make sure you discuss any questions you have with your health care provider.   Document Released: 12/14/2005 Document Revised: 03/12/2014 Document Reviewed: 10/12/2013 Elsevier Interactive Patient Education 2016 [place].   Influenza Virus Vaccine injection What is this medicine? INFLUENZA VIRUS VACCINE (in floo EN zuh VAHY ruhs vak SEEN) helps to reduce the risk of getting influenza also known as the flu. The vaccine only helps protect you against some strains of the flu. This medicine may be used for other purposes; ask your health care provider or pharmacist if you have questions. What should I tell my health care provider before I take this medicine? They need to know if you have any of these conditions: -bleeding disorder like hemophilia -fever or infection -Guillain-Barre syndrome or other neurological problems -immune system problems -infection with the human immunodeficiency virus (HIV) or AIDS -low blood platelet counts -multiple sclerosis -an unusual or allergic reaction to influenza virus vaccine, latex, other medicines, foods, dyes, or preservatives. Different brands of vaccines contain different allergens. Some may contain latex or eggs. Talk to your  doctor about your allergies to make sure that you get the right vaccine. -pregnant or trying to get pregnant -breast-feeding How should I use this medicine? This vaccine is for injection into a muscle or under the skin. It is given by a health care professional. A copy of Vaccine Information Statements will be given before each vaccination. Read this sheet carefully each time. The sheet may change frequently. Talk to your healthcare provider to see which vaccines are right for you. Some vaccines should not be used in all age groups. Overdosage: If you think you have taken too much of this medicine contact a poison control center or emergency room at once. NOTE: This medicine is only for you. Do not share this medicine with others. What if I miss a dose? This does not apply. What may interact with this medicine? -chemotherapy or radiation therapy -medicines that lower  your immune system like etanercept, anakinra, infliximab, and adalimumab -medicines that treat or prevent blood clots like warfarin -phenytoin -steroid medicines like prednisone or cortisone -theophylline -vaccines This list may not describe all possible interactions. Give your health care provider a list of all the medicines, herbs, non-prescription drugs, or dietary supplements you use. Also tell them if you smoke, drink alcohol, or use illegal drugs. Some items may interact with your medicine. What should I watch for while using this medicine? Report any side effects that do not go away within 3 days to your doctor or health care professional. Call your health care provider if any unusual symptoms occur within 6 weeks of receiving this vaccine. You may still catch the flu, but the illness is not usually as bad. You cannot get the flu from the vaccine. The vaccine will not protect against colds or other illnesses that may cause fever. The vaccine is needed every year. What side effects may I notice from receiving this  medicine? Side effects that you should report to your doctor or health care professional as soon as possible: -allergic reactions like skin rash, itching or hives, swelling of the face, lips, or tongue Side effects that usually do not require medical attention (report to your doctor or health care professional if they continue or are bothersome): -fever -headache -muscle aches and pains -pain, tenderness, redness, or swelling at the injection site -tiredness This list may not describe all possible side effects. Call your doctor for medical advice about side effects. You may report side effects to FDA at 1-800-FDA-1088. Where should I keep my medicine? The vaccine will be given by a health care professional in a clinic, pharmacy, doctor's office, or other health care setting. You will not be given vaccine doses to store at home. NOTE: This sheet is a summary. It may not cover all possible information. If you have questions about this medicine, talk to your doctor, pharmacist, or health care provider.    2016, Elsevier/Gold Standard. (2014-09-10 10:07:28)

## 2015-01-12 LAB — COMPREHENSIVE METABOLIC PANEL
ALK PHOS: 76 IU/L (ref 39–117)
ALT: 11 IU/L (ref 0–32)
AST: 16 IU/L (ref 0–40)
Albumin/Globulin Ratio: 1.6 (ref 1.1–2.5)
Albumin: 4.5 g/dL (ref 3.5–5.5)
BUN/Creatinine Ratio: 24 — ABNORMAL HIGH (ref 9–23)
BUN: 16 mg/dL (ref 6–24)
Bilirubin Total: 0.8 mg/dL (ref 0.0–1.2)
CO2: 21 mmol/L (ref 18–29)
CREATININE: 0.68 mg/dL (ref 0.57–1.00)
Calcium: 9.2 mg/dL (ref 8.7–10.2)
Chloride: 102 mmol/L (ref 97–106)
GFR calc Af Amer: 126 mL/min/{1.73_m2} (ref >59)
GFR calc non Af Amer: 109 mL/min/{1.73_m2} (ref >59)
GLOBULIN, TOTAL: 2.9 g/dL (ref 1.5–4.5)
Glucose: 96 mg/dL (ref 65–99)
POTASSIUM: 4.1 mmol/L (ref 3.5–5.2)
SODIUM: 138 mmol/L (ref 136–144)
Total Protein: 7.4 g/dL (ref 6.0–8.5)

## 2015-01-12 LAB — CBC
HEMATOCRIT: 40.8 % (ref 34.0–46.6)
Hemoglobin: 13.9 g/dL (ref 11.1–15.9)
MCH: 31 pg (ref 26.6–33.0)
MCHC: 34.1 g/dL (ref 31.5–35.7)
MCV: 91 fL (ref 79–97)
PLATELETS: 203 10*3/uL (ref 150–379)
RBC: 4.48 x10E6/uL (ref 3.77–5.28)
RDW: 13.4 % (ref 12.3–15.4)
WBC: 5.3 10*3/uL (ref 3.4–10.8)

## 2015-01-12 LAB — FERRITIN: Ferritin: 18 ng/mL (ref 15–150)

## 2015-01-12 LAB — VITAMIN D 25 HYDROXY (VIT D DEFICIENCY, FRACTURES): VIT D 25 HYDROXY: 28 ng/mL — AB (ref 30.0–100.0)

## 2015-01-12 LAB — LIPID PANEL
CHOLESTEROL TOTAL: 154 mg/dL (ref 100–199)
Chol/HDL Ratio: 2.8 ratio units (ref 0.0–4.4)
HDL: 56 mg/dL (ref >39)
LDL Calculated: 88 mg/dL (ref 0–99)
Triglycerides: 51 mg/dL (ref 0–149)
VLDL Cholesterol Cal: 10 mg/dL (ref 5–40)

## 2015-01-13 ENCOUNTER — Other Ambulatory Visit: Payer: Self-pay | Admitting: Obstetrics and Gynecology

## 2015-01-14 ENCOUNTER — Telehealth: Payer: Self-pay | Admitting: *Deleted

## 2015-01-14 NOTE — Telephone Encounter (Signed)
Left detailed message about pts labs

## 2015-01-14 NOTE — Telephone Encounter (Signed)
-----   Message from Evonnie Pat, [place] sent at 01/13/2015 11:32 AM EST ----- Please let her know labs were all normal except vit is still low- she needs to continue on weekly supplements, i believe she is good on refills

## 2015-06-03 ENCOUNTER — Other Ambulatory Visit: Payer: Self-pay | Admitting: Obstetrics and Gynecology

## 2015-06-03 DIAGNOSIS — Z1231 Encounter for screening mammogram for malignant neoplasm of breast: Secondary | ICD-10-CM

## 2015-06-15 ENCOUNTER — Ambulatory Visit: Payer: Self-pay

## 2015-06-20 ENCOUNTER — Other Ambulatory Visit: Payer: Self-pay | Admitting: Obstetrics and Gynecology

## 2015-06-20 ENCOUNTER — Telehealth: Payer: Self-pay | Admitting: Obstetrics and Gynecology

## 2015-06-20 ENCOUNTER — Ambulatory Visit
Admission: RE | Admit: 2015-06-20 | Discharge: 2015-06-20 | Disposition: A | Discharge status: Home / Self Care | Payer: BLUE CROSS/BLUE SHIELD | Source: Ambulatory Visit | Attending: Obstetrics and Gynecology | Admitting: Obstetrics and Gynecology

## 2015-06-20 DIAGNOSIS — Z1231 Encounter for screening mammogram for malignant neoplasm of breast: Secondary | ICD-10-CM

## 2015-06-20 NOTE — Telephone Encounter (Signed)
Pt was here 2 mos ago and talked to Melody about having either a MRI breast or 3 D mammography. Melody told her we would check her ins to see if it was covered.

## 2015-06-20 NOTE — Telephone Encounter (Signed)
Hey mel she went for mammo today, they did 3-d [phone]

## 2015-08-30 ENCOUNTER — Telehealth: Payer: Self-pay | Admitting: Obstetrics and Gynecology

## 2015-08-30 ENCOUNTER — Other Ambulatory Visit: Payer: Self-pay | Admitting: *Deleted

## 2015-08-30 MED ORDER — TRANEXAMIC ACID 650 MG PO TABS: 650.0000 mg | ORAL_TABLET | Freq: Three times a day (TID) | ORAL | Status: DC

## 2015-08-30 NOTE — Telephone Encounter (Signed)
Done-ac 

## 2015-08-30 NOTE — Telephone Encounter (Signed)
PT NEEDS LYSTEDA REFILLED

## 2016-01-12 ENCOUNTER — Ambulatory Visit (INDEPENDENT_AMBULATORY_CARE_PROVIDER_SITE_OTHER): Payer: BLUE CROSS/BLUE SHIELD | Admitting: Obstetrics and Gynecology

## 2016-01-12 ENCOUNTER — Encounter: Payer: Self-pay | Admitting: Obstetrics and Gynecology

## 2016-01-12 ENCOUNTER — Other Ambulatory Visit: Payer: Self-pay | Admitting: Obstetrics and Gynecology

## 2016-01-12 VITALS — BP 103/70 | HR 78 | Ht 70.0 in | Wt 133.8 lb

## 2016-01-12 DIAGNOSIS — Z01419 Encounter for gynecological examination (general) (routine) without abnormal findings: Secondary | ICD-10-CM

## 2016-01-12 DIAGNOSIS — N6019 Diffuse cystic mastopathy of unspecified breast: Secondary | ICD-10-CM

## 2016-01-12 DIAGNOSIS — N920 Excessive and frequent menstruation with regular cycle: Secondary | ICD-10-CM | POA: Diagnosis not present

## 2016-01-12 DIAGNOSIS — Z803 Family history of malignant neoplasm of breast: Secondary | ICD-10-CM | POA: Diagnosis not present

## 2016-01-12 DIAGNOSIS — Z23 Encounter for immunization: Secondary | ICD-10-CM | POA: Diagnosis not present

## 2016-01-12 NOTE — Patient Instructions (Signed)
Place annual gynecologic exam patient instructions here.

## 2016-01-12 NOTE — Progress Notes (Signed)
Subjective:   Tonya Leonard is a 40 y.o. G51P3 Caucasian female here for a routine well-woman exam.  Patient's last menstrual period was 01/12/2016.    Current complaints: none PCP: Burt Ek      Does desire labs and flu vaccine  Social History: Sexual: heterosexual Marital Status: married Living situation: with spouse Occupation: homemaker Tobacco/alcohol: Never smoked, social ETOH use, caffeine use Illicit drugs: no history of illicit drug use  The following portions of the patient's history were reviewed and updated as appropriate: allergies, current medications, past family history, past medical history, past social history, past surgical history and problem list.  Past Medical History Past Medical History:  Diagnosis Date  . Breast mass 2013   right breast    Past Surgical History Past Surgical History:  Procedure Laterality Date  . BREAST BIOPSY Right 12/2011  . BREAST CYST ASPIRATION Left     Gynecologic History G3P3  Patient's last menstrual period was 01/12/2016. Contraception: vasectomy Last Pap: 2015. Results were: normal Last mammogram: 06/2015. Results were: normal (increased density with multiple fibrocystic changes bilaterally. MRI was recommended by radiology)   Obstetric History OB History  Gravida Para Term Preterm AB Living  3 3       3   SAB TAB Ectopic Multiple Live Births        1      # Outcome Date GA Lbr Len/2nd Weight Sex Delivery Anes PTL Lv  3 Para           2 Para           1 Para             Obstetric Comments  1st Menstrual Cycle:  10  1st Pregnancy:  24  1 child still born at 46 weeks  1 set of twins    Current Medications Current Outpatient Prescriptions on File Prior to Visit  Medication Sig Dispense Refill  . tranexamic acid (LYSTEDA) 650 MG TABS tablet Take 1 tablet (650 mg total) by mouth 3 (three) times daily. 60 tablet 3  . ergocalciferol (VITAMIN D2) 50000 UNITS capsule Take 50,000 Units by mouth once a week.      No current facility-administered medications on file prior to visit.     Review of Systems Patient denies any headaches, blurred vision, shortness of breath, chest pain, abdominal pain, problems with bowel movements, urination, or intercourse.  Objective:  BP 103/70   Pulse 78   Ht 5\' 10"  (1.778 m)   Wt 133 lb 12.8 oz (60.7 kg)   LMP 01/12/2016   BMI 19.20 kg/m  Physical Exam  General:  Well developed, well nourished, no acute distress. She is alert and oriented x3. Skin:  Warm and dry Neck:  Midline trachea, no thyromegaly or nodules Cardiovascular: Regular rate and rhythm, no murmur heard Lungs:  Effort normal, all lung fields clear to auscultation bilaterally Breasts:  No dominant palpable mass, retraction, or nipple discharge, fibrocystic changes noted bilaterally Abdomen:  Soft, non tender, no hepatosplenomegaly or masses Pelvic:  External genitalia is normal in appearance.  The vagina is normal in appearance. The cervix is bulbous, no CMT.  Thin prep pap is done with HR HPV cotesting. Uterus is felt to be normal size, shape, and contour.  No adnexal masses or tenderness noted. Rectal: Good sphincter tone, no polyps, or hemorrhoids felt.  Extremities:  No swelling or varicosities noted Psych:  She has a normal mood and affect  Assessment:   Healthy well-woman exam Family  History of Breast CA Fibrocystic Breasts Bilaterally Heavy Menses Need for flu vaccine  Plan:  MRI ordered for Breasts and a Letter of Medical Necessity will be sent to insurance. Continue Lysteda for heavy menses Flu vaccine given Routine labs pending F/U in 1 year for AE, or sooner if needed Mammogram 4/18 or sooner if problems.Marland KitchenMRI ordered  Thea Gist, RN, Student FNP Melody Rockney Ghee, CNM

## 2016-01-13 ENCOUNTER — Other Ambulatory Visit: Payer: Self-pay | Admitting: Obstetrics and Gynecology

## 2016-01-13 DIAGNOSIS — E559 Vitamin D deficiency, unspecified: Secondary | ICD-10-CM | POA: Insufficient documentation

## 2016-01-13 LAB — CYTOLOGY - PAP

## 2016-01-13 LAB — COMPREHENSIVE METABOLIC PANEL
A/G RATIO: 1.9 (ref 1.2–2.2)
ALT: 12 IU/L (ref 0–32)
AST: 18 IU/L (ref 0–40)
Albumin: 4.7 g/dL (ref 3.5–5.5)
Alkaline Phosphatase: 68 IU/L (ref 39–117)
BUN/Creatinine Ratio: 22 (ref 9–23)
BUN: 17 mg/dL (ref 6–24)
Bilirubin Total: 0.8 mg/dL (ref 0.0–1.2)
CALCIUM: 9.1 mg/dL (ref 8.7–10.2)
CO2: 24 mmol/L (ref 18–29)
Chloride: 102 mmol/L (ref 96–106)
Creatinine, Ser: 0.77 mg/dL (ref 0.57–1.00)
GFR, EST AFRICAN AMERICAN: 110 mL/min/{1.73_m2} (ref >59)
GFR, EST NON AFRICAN AMERICAN: 96 mL/min/{1.73_m2} (ref >59)
Globulin, Total: 2.5 g/dL (ref 1.5–4.5)
Glucose: 98 mg/dL (ref 65–99)
POTASSIUM: 4.5 mmol/L (ref 3.5–5.2)
Sodium: 139 mmol/L (ref 134–144)
TOTAL PROTEIN: 7.2 g/dL (ref 6.0–8.5)

## 2016-01-13 LAB — CBC
Hematocrit: 40.8 % (ref 34.0–46.6)
Hemoglobin: 13.9 g/dL (ref 11.1–15.9)
MCH: 31.6 pg (ref 26.6–33.0)
MCHC: 34.1 g/dL (ref 31.5–35.7)
MCV: 93 fL (ref 79–97)
PLATELETS: 217 10*3/uL (ref 150–379)
RBC: 4.4 x10E6/uL (ref 3.77–5.28)
RDW: 12.5 % (ref 12.3–15.4)
WBC: 6.1 10*3/uL (ref 3.4–10.8)

## 2016-01-13 LAB — LIPID PANEL
CHOL/HDL RATIO: 2.8 ratio (ref 0.0–4.4)
CHOLESTEROL TOTAL: 163 mg/dL (ref 100–199)
HDL: 58 mg/dL (ref >39)
LDL Calculated: 96 mg/dL (ref 0–99)
TRIGLYCERIDES: 46 mg/dL (ref 0–149)
VLDL Cholesterol Cal: 9 mg/dL (ref 5–40)

## 2016-01-13 LAB — B12 AND FOLATE PANEL
FOLATE: 14.5 ng/mL (ref >3.0)
VITAMIN B 12: 567 pg/mL (ref 211–946)

## 2016-01-13 LAB — IRON: Iron: 127 ug/dL (ref 27–159)

## 2016-01-13 LAB — VITAMIN D 25 HYDROXY (VIT D DEFICIENCY, FRACTURES): Vit D, 25-Hydroxy: 28.6 ng/mL — ABNORMAL LOW (ref 30.0–100.0)

## 2016-01-13 MED ORDER — ERGOCALCIFEROL 1.25 MG (50000 UT) PO CAPS: 50000.0000 [IU] | ORAL_CAPSULE | ORAL | 1 refills | Status: DC

## 2016-02-08 ENCOUNTER — Telehealth: Payer: Self-pay | Admitting: Obstetrics and Gynecology

## 2016-02-08 NOTE — Telephone Encounter (Signed)
Patient called requesting to speak with you. Thanks °

## 2016-02-09 ENCOUNTER — Telehealth: Payer: Self-pay | Admitting: Obstetrics and Gynecology

## 2016-02-09 NOTE — Telephone Encounter (Signed)
PT CALLED AND LEFT VM ON OFFICE REQ A CALL BACK FROM YOU OR MELODY.

## 2016-02-09 NOTE — Telephone Encounter (Signed)
Mel would you call her please

## 2016-03-08 ENCOUNTER — Other Ambulatory Visit: Payer: Self-pay | Admitting: Obstetrics and Gynecology

## 2016-06-05 ENCOUNTER — Telehealth: Payer: Self-pay | Admitting: Obstetrics and Gynecology

## 2016-06-05 NOTE — Telephone Encounter (Signed)
pls advise

## 2016-06-05 NOTE — Telephone Encounter (Signed)
Patient was wondering if she had an MRI of her breast performed if it would be covered by her insurance or if she would have to pay for it. If not, then she'll just take the 3D U/S of the breast at [facility]. Please advise

## 2016-06-06 NOTE — Telephone Encounter (Signed)
I thought we got prior approval Leda Gauze helped with it or was working on it). Can yo check and see. If, not we can call her insurance and see which they will cover.

## 2016-07-12 ENCOUNTER — Telehealth: Payer: Self-pay | Admitting: Obstetrics and Gynecology

## 2016-07-12 NOTE — Telephone Encounter (Signed)
Patient called wanting to speak with you regarding her MRI not being covered by insurance and about a 3d mammo. Thanks

## 2016-07-13 ENCOUNTER — Other Ambulatory Visit: Payer: Self-pay | Admitting: Obstetrics and Gynecology

## 2016-07-13 DIAGNOSIS — R922 Inconclusive mammogram: Secondary | ICD-10-CM

## 2016-07-13 NOTE — Telephone Encounter (Signed)
Please let her know I ordered the 3D and to go ahead and schedule

## 2016-07-17 ENCOUNTER — Other Ambulatory Visit: Payer: Self-pay | Admitting: Obstetrics and Gynecology

## 2016-07-17 ENCOUNTER — Ambulatory Visit
Admission: RE | Admit: 2016-07-17 | Discharge: 2016-07-17 | Disposition: A | Discharge status: Home / Self Care | Payer: BLUE CROSS/BLUE SHIELD | Source: Ambulatory Visit | Attending: Obstetrics and Gynecology | Admitting: Obstetrics and Gynecology

## 2016-07-17 DIAGNOSIS — R928 Other abnormal and inconclusive findings on diagnostic imaging of breast: Secondary | ICD-10-CM | POA: Insufficient documentation

## 2016-07-17 DIAGNOSIS — N6320 Unspecified lump in the left breast, unspecified quadrant: Secondary | ICD-10-CM | POA: Insufficient documentation

## 2016-07-17 DIAGNOSIS — Z1231 Encounter for screening mammogram for malignant neoplasm of breast: Secondary | ICD-10-CM | POA: Insufficient documentation

## 2016-07-17 DIAGNOSIS — R922 Inconclusive mammogram: Secondary | ICD-10-CM

## 2016-07-19 ENCOUNTER — Ambulatory Visit (HOSPITAL_COMMUNITY): Admission: RE | Admit: 2016-07-19 | Payer: BLUE CROSS/BLUE SHIELD | Source: Ambulatory Visit

## 2016-07-20 ENCOUNTER — Ambulatory Visit
Admission: RE | Admit: 2016-07-20 | Discharge: 2016-07-20 | Disposition: A | Discharge status: Home / Self Care | Payer: BLUE CROSS/BLUE SHIELD | Source: Ambulatory Visit | Attending: Obstetrics and Gynecology | Admitting: Obstetrics and Gynecology

## 2016-07-20 DIAGNOSIS — R928 Other abnormal and inconclusive findings on diagnostic imaging of breast: Secondary | ICD-10-CM | POA: Diagnosis present

## 2016-07-20 DIAGNOSIS — N6489 Other specified disorders of breast: Secondary | ICD-10-CM | POA: Diagnosis not present

## 2016-07-20 DIAGNOSIS — N6002 Solitary cyst of left breast: Secondary | ICD-10-CM | POA: Diagnosis not present

## 2016-07-26 ENCOUNTER — Ambulatory Visit: Payer: BLUE CROSS/BLUE SHIELD

## 2016-09-25 DIAGNOSIS — D485 Neoplasm of uncertain behavior of skin: Secondary | ICD-10-CM | POA: Diagnosis not present

## 2016-09-25 DIAGNOSIS — D229 Melanocytic nevi, unspecified: Secondary | ICD-10-CM | POA: Diagnosis not present

## 2017-01-17 ENCOUNTER — Encounter: Payer: BLUE CROSS/BLUE SHIELD | Admitting: Obstetrics and Gynecology

## 2017-02-06 ENCOUNTER — Encounter: Payer: BLUE CROSS/BLUE SHIELD | Admitting: Obstetrics and Gynecology

## 2017-04-03 ENCOUNTER — Encounter: Payer: BLUE CROSS/BLUE SHIELD | Admitting: Obstetrics and Gynecology

## 2017-04-04 ENCOUNTER — Ambulatory Visit (INDEPENDENT_AMBULATORY_CARE_PROVIDER_SITE_OTHER): Payer: BLUE CROSS/BLUE SHIELD | Admitting: Obstetrics and Gynecology

## 2017-04-04 ENCOUNTER — Encounter: Payer: Self-pay | Admitting: Obstetrics and Gynecology

## 2017-04-04 VITALS — BP 98/66 | HR 81 | Wt 142.3 lb

## 2017-04-04 DIAGNOSIS — N6012 Diffuse cystic mastopathy of left breast: Secondary | ICD-10-CM | POA: Diagnosis not present

## 2017-04-04 DIAGNOSIS — Z803 Family history of malignant neoplasm of breast: Secondary | ICD-10-CM | POA: Diagnosis not present

## 2017-04-04 DIAGNOSIS — Z1321 Encounter for screening for nutritional disorder: Secondary | ICD-10-CM | POA: Diagnosis not present

## 2017-04-04 DIAGNOSIS — N920 Excessive and frequent menstruation with regular cycle: Secondary | ICD-10-CM

## 2017-04-04 DIAGNOSIS — R52 Pain, unspecified: Secondary | ICD-10-CM | POA: Diagnosis not present

## 2017-04-04 DIAGNOSIS — G2581 Restless legs syndrome: Secondary | ICD-10-CM | POA: Diagnosis not present

## 2017-04-04 DIAGNOSIS — N6011 Diffuse cystic mastopathy of right breast: Secondary | ICD-10-CM

## 2017-04-04 DIAGNOSIS — Z01419 Encounter for gynecological examination (general) (routine) without abnormal findings: Secondary | ICD-10-CM

## 2017-04-04 NOTE — Progress Notes (Signed)
GYNECOLOGY ANNUAL PHYSICAL EXAM PROGRESS NOTE  Subjective:    Tonya Leonard is a 42 y.o. G66P2013 female who presents for an annual exam. The patient is sexually active.  The patient wears seatbelts: yes. The patient participates in regular exercise: yes. Has the patient ever been transfused or tattooed?: no. The patient reports that there is not domestic violence in her life.   The patient has the following complaints today:   1. She reports a history of heavy menstrual cycles. She is currently on Lysteda, has used for almost a year.  She reports that some months are regular flow, not painful, however there are still other months where the flow is heavy, with passage of clots and dysmenorrhea.  2. Patient reports random onset of a cluster of vague symptoms over the past several years (3 episodes so far), including body aches, general malaise, nausea, headaches, sore neck (sometimes feels like there is swelling, as well as tender to touch, but denies rigidity). Symptoms usually last for several weeks and then resolve without intervention. Has been seen by a PCP several times and has had a workup done at least twice but denies being told of any abnormal results the first time. She states that the second time she went for more blood work she was never told of the results, even after attempting to contact the provider.   3. Patient also noting over the past year having to deal with restless legs off and on over the past year.  Notes that it does not bother her as much when sleeping, but several hours before bed she has to move around frequently.  She also states that she is very sensitive to touch (outside of her own touch), where it feels as though her legs are stinging, burning, or on "fire".   Gynecologic History:  Menarche age: 8 Patient's last menstrual period was 03/19/2017. Contraception: rhythm method History of STI's: Denies Last Pap: 01/2016. Results were: normal.  Denies h/o abnormal  pap smears. Last mammogram: 07/2016. Results were: abnormal (BIRADS 0, possible mass of left breast) followed by left breast ultrasound which noted 2 benign cysts.    Obstetric History   G3   P3   T0   P0   A0   L3    SAB0   TAB0   Ectopic0   Multiple1   Live Births0     # Outcome Date GA Lbr Len/2nd Weight Sex Delivery Anes PTL Lv  3 Para           2 Para           1 Para             Obstetric Comments  1st Menstrual Cycle:  10  1st Pregnancy:  24  1 child still born at 19 weeks  1 set of twins    Past Medical History:  Diagnosis Date  . Breast mass 2013   right breast  . Fibrocystic breast changes of both breasts     Past Surgical History:  Procedure Laterality Date  . BREAST BIOPSY Right 12/2011  . BREAST CYST ASPIRATION Left     Family History  Problem Relation Age of Onset  . Cancer Maternal Aunt 60       breast  . Breast cancer Maternal Aunt   . Cancer Maternal Aunt 51       breast  . Breast cancer Maternal Aunt   . Cancer Maternal Grandfather 46  breast   . Breast cancer Maternal Grandfather     Social History   Socioeconomic History  . Marital status: Married    Spouse name: Not on file  . Number of children: Not on file  . Years of education: Not on file  . Highest education level: Not on file  Social Needs  . Financial resource strain: Not on file  . Food insecurity - worry: Not on file  . Food insecurity - inability: Not on file  . Transportation needs - medical: Not on file  . Transportation needs - non-medical: Not on file  Occupational History  . Not on file  Tobacco Use  . Smoking status: Never Smoker  . Smokeless tobacco: Never Used  Substance and Sexual Activity  . Alcohol use: Yes    Comment: occas  . Drug use: No  . Sexual activity: Yes    Birth control/protection: Condom  Other Topics Concern  . Not on file  Social History Narrative  . Not on file    Current Outpatient Medications on File Prior to Visit  Medication  Sig Dispense Refill  . ergocalciferol (VITAMIN D2) 50000 units capsule Take 1 capsule (50,000 Units total) by mouth 2 (two) times a week. 30 capsule 1  . tranexamic acid (LYSTEDA) 650 MG TABS tablet Take 1 tablet (650 mg total) by mouth 3 (three) times daily. 60 tablet 3   No current facility-administered medications on file prior to visit.     No Known Allergies   Review of Systems Constitutional: negative for chills, fatigue, fevers and sweats.  Positive for body aches (see HPI) Eyes: negative for irritation, redness and visual disturbance Ears, nose, mouth, throat, and face: negative for hearing loss, nasal congestion, snoring and tinnitus Respiratory: negative for asthma, cough, sputum. Positive for neck pain/swelling/tenderness  (see HPI) Cardiovascular: negative for chest pain, dyspnea, exertional chest pressure/discomfort, irregular heart beat, palpitations and syncope Gastrointestinal: negative for abdominal pain, change in bowel habits, and vomiting. Positive for nausea  (see HPI) Genitourinary: negative for genital lesions, sexual problems and vaginal discharge, dysuria and urinary incontinence.  Positive for intermittent heavy menstrual periods (see HPI) Integument/breast: negative for breast lump, breast tenderness and nipple discharge Hematologic/lymphatic: negative for bleeding and easy bruising Musculoskeletal:negative for back pain and muscle weakness Neurological: negative for dizziness, headaches, vertigo and weakness Endocrine: negative for diabetic symptoms including polydipsia, polyuria and skin dryness Allergic/Immunologic: negative for hay fever and urticaria      Objective:  Blood pressure 98/66, pulse 81, weight 142 lb 4.8 oz (64.5 kg), last menstrual period 03/19/2017. Body mass index is 20.42 kg/m.  General Appearance:    Alert, cooperative, no distress, appears stated age  Head:    Normocephalic, without obvious abnormality, atraumatic  Eyes:    PERRL,  conjunctiva/corneas clear, EOM's intact, both eyes  Ears:    Normal external ear canals, both ears  Nose:   Nares normal, septum midline, mucosa normal, no drainage or sinus tenderness  Throat:   Lips, mucosa, and tongue normal; teeth and gums normal  Neck:   Supple, symmetrical, trachea midline, no adenopathy; thyroid: no enlargement/tenderness/nodules; no carotid bruit or JVD  Back:     Symmetric, no curvature, ROM normal, no CVA tenderness  Lungs:     Clear to auscultation bilaterally, respirations unlabored  Chest Wall:    No tenderness or deformity   Heart:    Regular rate and rhythm, S1 and S2 normal, no murmur, rub or gallop  Breast Exam:  No tenderness, masses, or nipple abnormality.  Fibrocystic changes present bilaterally, right breast more than left.   Abdomen:     Soft, non-tender, bowel sounds active all four quadrants, no masses, no organomegaly.    Genitalia:    Pelvic:external genitalia normal, vagina without lesions, discharge, or tenderness, rectovaginal septum  normal. Cervix normal in appearance, no cervical motion tenderness, no adnexal masses or tenderness.  Uterus normal size, shape, mobile, regular contours, nontender.  Rectal:    Normal external sphincter.  No hemorrhoids appreciated. Internal exam not done.   Extremities:   Extremities normal, atraumatic, no cyanosis or edema  Pulses:   2+ and symmetric all extremities  Skin:   Skin color, texture, turgor normal, no rashes or lesions  Lymph nodes:   Cervical, supraclavicular, and axillary nodes normal  Neurologic:   CNII-XII intact, normal strength, sensation and reflexes throughout   . Labs:  Lab Results  Component Value Date   WBC 6.1 01/12/2016   HGB 13.9 01/12/2016   HCT 40.8 01/12/2016   MCV 93 01/12/2016   PLT 217 01/12/2016    Lab Results  Component Value Date   CREATININE 0.77 01/12/2016   BUN 17 01/12/2016   NA 139 01/12/2016   K 4.5 01/12/2016   CL 102 01/12/2016   CO2 24 01/12/2016    Lab  Results  Component Value Date   ALT 12 01/12/2016   AST 18 01/12/2016   ALKPHOS 68 01/12/2016   BILITOT 0.8 01/12/2016    Lab Results  Component Value Date   CHOL 163 01/12/2016   HDL 58 01/12/2016   LDLCALC 96 01/12/2016   TRIG 46 01/12/2016   CHOLHDL 2.8 01/12/2016    Lab Results  Component Value Date   TSH 1.59 01/05/2014     Assessment:   Routine gynecologic exam.  Menorrhagia with regular menses  Viral syndrome? Restless leg Fibrocystic breast changes Family history of breast cancer  Plan:    1. Routine gynecologic exam - Blood tests: CBC with diff, Comprehensive metabolic panel, TSH and Vitamin D level. - Breast self exam technique reviewed and patient encouraged to perform self-exam monthly. - Contraception: rhythm method. - Discussed healthy lifestyle modifications. - Mammogram ordered. Would recommend 3-D due to h/o fibrocystic breasts and family history. - Pap smear up to date.   2. Menorrhagia with regular cycle - Patient currently taking Lysteda for current management.  Discussed other options for heavy menses, including  oral progesterone, Depo Provera, Mirena IUD, endometrial ablation (Novasure/Hydrothermal Ablation) or hysterectomy as definitive surgical management.  Patient does not desire any hormonal method.  Had considered an endometrial ablation 2 years ago, but notes she backed out due to fear and cost of procedure. Will remain on Lysteda for now.   3. Viral syndrome? - Patient with episodic onset of vague cluster of symptoms and malaise.  Patient notes that she has been worked up by a PCP in the past with no significant findings. Questioned if patient ever had a viral syndrome in the past such as mononucleiosis. She states that she thinks she had it in middle school.  Notes PCP also discussed possibility of reactivation of symptoms/syndrome.  Advised that this was out of the GYN scope, and she may benefit from going back to discuss with PCP, or  referral to a specialist such as a Rheumatologist as it may even be autoimmune. Also encouraged patient to seek holistic approach as she notes having a friend who does holistic massages and has been helping  her some advice that has helped.   4. Restless leg - Discussed management options for restless leg.  Unsure if this is a new symptom that should be included with viral syndrome symptoms or if this is separate.  Symptoms also possibly suggestive of fibromyalgia (due to intensity and type of pain associated, such as burning, stinging pain). Offered options for treatment including Gabapentin or Lyrica. Advised that otherwise she may need to discuss further with her PCP.   5. Family h/o breast cancer and fibrocystic breast changes - Mammogram ordered, recommend 3-D (see above). Discussion had with patient regarding family history, she would be be a candidate for hereditary breast cancer screening.    A total of 40 minutes were spent face-to-face with the patient during the encounter and over half of that time involved counseling and coordination of care.   Rubie Maid, MD [facility] -

## 2017-04-04 NOTE — Patient Instructions (Signed)

## 2017-04-05 ENCOUNTER — Encounter: Payer: Self-pay | Admitting: Obstetrics and Gynecology

## 2017-04-05 ENCOUNTER — Encounter: Payer: BLUE CROSS/BLUE SHIELD | Admitting: Obstetrics and Gynecology

## 2017-04-05 LAB — COMPREHENSIVE METABOLIC PANEL
ALK PHOS: 76 IU/L (ref 39–117)
ALT: 62 IU/L — ABNORMAL HIGH (ref 0–32)
AST: 46 IU/L — AB (ref 0–40)
Albumin/Globulin Ratio: 1.6 (ref 1.2–2.2)
Albumin: 4.7 g/dL (ref 3.5–5.5)
BUN/Creatinine Ratio: 19 (ref 9–23)
BUN: 12 mg/dL (ref 6–24)
Bilirubin Total: 0.6 mg/dL (ref 0.0–1.2)
CO2: 25 mmol/L (ref 20–29)
CREATININE: 0.62 mg/dL (ref 0.57–1.00)
Calcium: 9.6 mg/dL (ref 8.7–10.2)
Chloride: 102 mmol/L (ref 96–106)
GFR calc Af Amer: 128 mL/min/{1.73_m2} (ref >59)
GFR calc non Af Amer: 111 mL/min/{1.73_m2} (ref >59)
GLUCOSE: 92 mg/dL (ref 65–99)
Globulin, Total: 2.9 g/dL (ref 1.5–4.5)
Potassium: 4.6 mmol/L (ref 3.5–5.2)
Sodium: 141 mmol/L (ref 134–144)
Total Protein: 7.6 g/dL (ref 6.0–8.5)

## 2017-04-05 LAB — TSH: TSH: 2 u[IU]/mL (ref 0.450–4.500)

## 2017-04-05 LAB — CBC
Hematocrit: 40 % (ref 34.0–46.6)
Hemoglobin: 13.7 g/dL (ref 11.1–15.9)
MCH: 31.8 pg (ref 26.6–33.0)
MCHC: 34.3 g/dL (ref 31.5–35.7)
MCV: 93 fL (ref 79–97)
Platelets: 168 10*3/uL (ref 150–379)
RBC: 4.31 x10E6/uL (ref 3.77–5.28)
RDW: 13.4 % (ref 12.3–15.4)
WBC: 5 10*3/uL (ref 3.4–10.8)

## 2017-04-05 LAB — VITAMIN D 25 HYDROXY (VIT D DEFICIENCY, FRACTURES): VIT D 25 HYDROXY: 22.7 ng/mL — AB (ref 30.0–100.0)

## 2017-04-10 ENCOUNTER — Telehealth: Payer: Self-pay | Admitting: Obstetrics and Gynecology

## 2017-04-10 NOTE — Telephone Encounter (Signed)
The patient called and stated that she would like to speak with a nurse in regards to not hearing anything back yet from her most recent bloodwork. Please advise.

## 2017-04-11 ENCOUNTER — Telehealth: Payer: Self-pay | Admitting: Obstetrics and Gynecology

## 2017-04-11 NOTE — Telephone Encounter (Signed)
Please apologize to her for me.  These labs were not in my box and I had to look them up individually.  Her labs returned normal except for her liver enzymes (borderline elevated, this can be seen if she is taking certain medications, including large amounts of Tylenol).  Her Vitamin D is still a little low, but not as low as it has been in the past. I recommend her taking a daily Vitamin D supplement, [phone] mIU.

## 2017-04-11 NOTE — Telephone Encounter (Signed)
The patient called and stated that she did not receive a call back from her message that was sent back yesterday. The patient would like a call in regards to her results. No other information was disclosed. Please advise.

## 2017-04-12 MED ORDER — TRANEXAMIC ACID 650 MG PO TABS: 650.0000 mg | ORAL_TABLET | Freq: Three times a day (TID) | ORAL | 3 refills | Status: DC

## 2017-04-12 MED ORDER — VITAMIN D 1000 UNITS PO TABS: 1000.0000 [IU] | ORAL_TABLET | Freq: Every day | ORAL | 11 refills | Status: DC

## 2017-04-12 NOTE — Telephone Encounter (Signed)
Pt called concerning her labs and blood work she had done on her last visit. Pt was very concerned about her liver enzymes levels and wanted to talk to [facility] about them. Pt stated that she is not taking any tylenol just ibuprofen less than 5 tablets in the last week. Pt was informed that [facility] wanted her to start taking vit d [phone] once a day to help increase her vit d level. Pt was also informed that vit d [phone] could be purchased OTC and didn't need a prescription. Went over notes that [facility] had from her last visit concerning suggestion for her to visit her PCP, rheumatologist or holistic specialist pt aware that message will be sent to [facility]

## 2017-04-12 NOTE — Telephone Encounter (Signed)
Contacted patient regarding lab results.  Discussed Vitamin D insufficiency, recommended daily tablets of [phone] IU of Vitamin D dailiy along with her regular multivitamin.  Also discussed mildly elevated liver enzymes.  Patient notes she is very concerned regarding this, in light of her other symptoms.  Patient denies heavy Tylenol or alcohol use, had a normal thyroid screen, and denies intake of any other medications/supplements that could be a potential cause. Discussed other potential reasons such as infection (Hepatitis), Celiac disease, and a host of other causes that could be a factor.  Also as patient has had labs elsewhere done within the past year, I requested that she request those records and view if her labs were abnormal then.  Patient states that she has already done this and her labs were normal then. Patient still concerned as to whether or not this could be related to her history of mononucleosis.  Advised patient once again on following up with Rhematology or her PCP  For further investigation of her cluster of symptoms.  Patient notes understanding.    Rubie Maid, MD [facility]

## 2017-04-12 NOTE — Telephone Encounter (Signed)
See another phone encounter.

## 2017-07-22 ENCOUNTER — Other Ambulatory Visit: Payer: Self-pay | Admitting: Obstetrics and Gynecology

## 2017-07-22 DIAGNOSIS — Z1231 Encounter for screening mammogram for malignant neoplasm of breast: Secondary | ICD-10-CM

## 2017-08-29 ENCOUNTER — Other Ambulatory Visit: Payer: Self-pay

## 2017-08-29 NOTE — Telephone Encounter (Signed)
Pt was called and informed that her health form paper work was completed. Pt requested that the paperwork be mailed to her. Copies were made of the paperwork and the originals were mailed to the pt.

## 2017-08-30 DIAGNOSIS — Z1231 Encounter for screening mammogram for malignant neoplasm of breast: Secondary | ICD-10-CM | POA: Diagnosis not present

## 2017-09-02 ENCOUNTER — Telehealth: Payer: Self-pay | Admitting: Obstetrics and Gynecology

## 2017-09-02 NOTE — Telephone Encounter (Signed)
Patient would like a call when her mammogram results are available, please, thank you

## 2017-09-03 ENCOUNTER — Ambulatory Visit: Payer: Self-pay | Admitting: General Surgery

## 2017-09-04 DIAGNOSIS — N6002 Solitary cyst of left breast: Secondary | ICD-10-CM | POA: Diagnosis not present

## 2017-09-04 DIAGNOSIS — R928 Other abnormal and inconclusive findings on diagnostic imaging of breast: Secondary | ICD-10-CM | POA: Diagnosis not present

## 2017-09-06 NOTE — Telephone Encounter (Signed)
Pt is aware of her test results.  

## 2017-09-10 ENCOUNTER — Ambulatory Visit: Payer: BLUE CROSS/BLUE SHIELD | Admitting: General Surgery

## 2017-12-23 IMAGING — US US BREAST*L* LIMITED INC AXILLA
1 series · 10 of 10 positions shown · non-contrast
Comparison: 07/17/2016 and earlier priors

CLINICAL DATA: 40-year-old patient recalled from recent 3D
screening mammogram for evaluation of possible masses in the left
breast.

EXAM:
ULTRASOUND OF THE LEFT BREAST

[Series 1: us breast*left* limited inc axilla · 0.07mm/px · 10 of 10 slices shown]
[im 1/10]
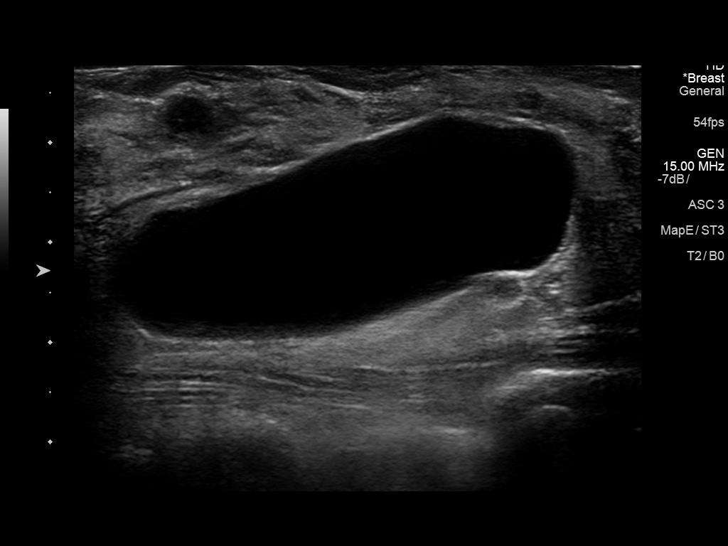
[im 2/10]
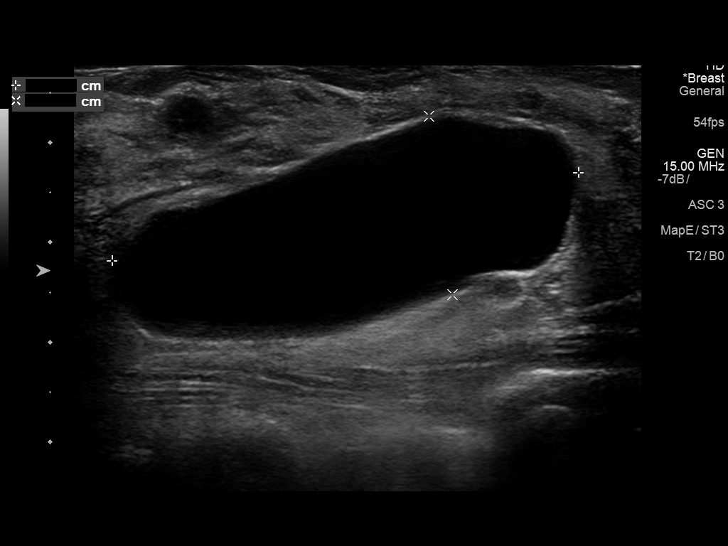
[im 3/10]
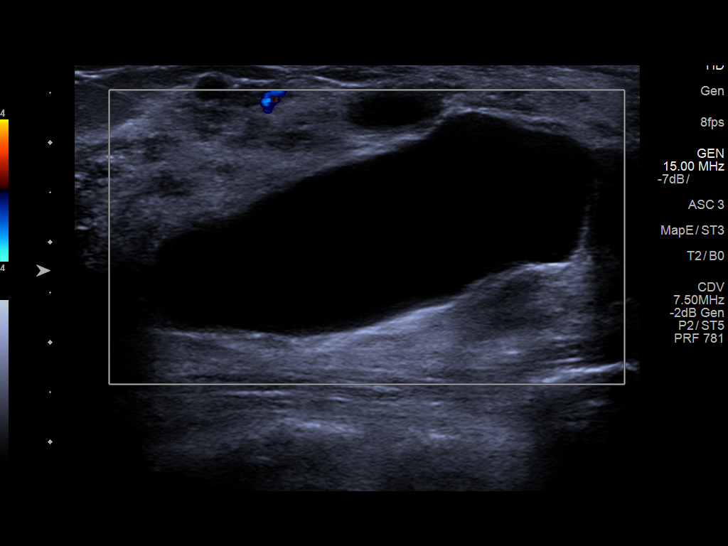
[im 4/10]
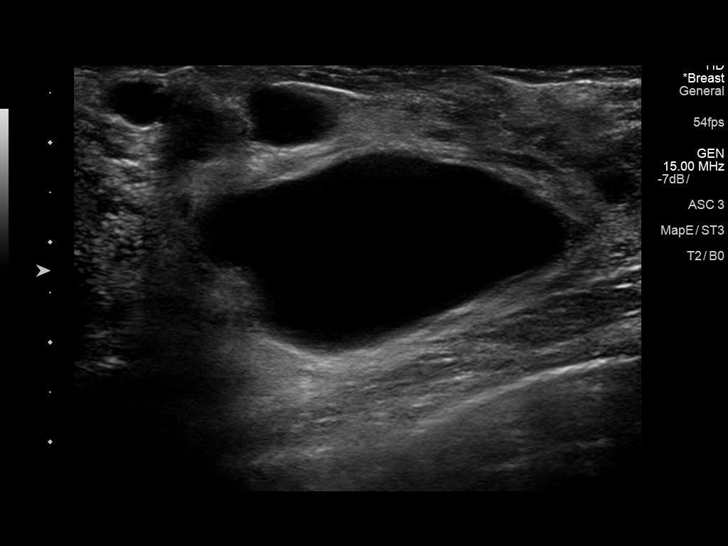
[im 5/10]
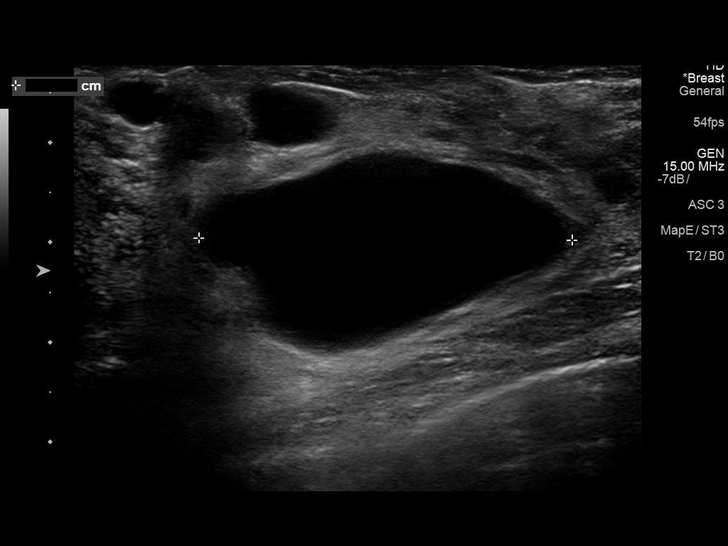
[im 6/10]
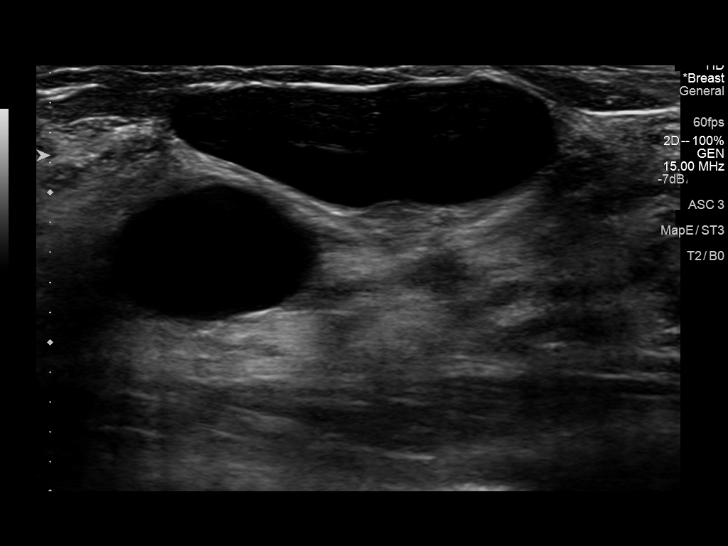
[im 7/10]
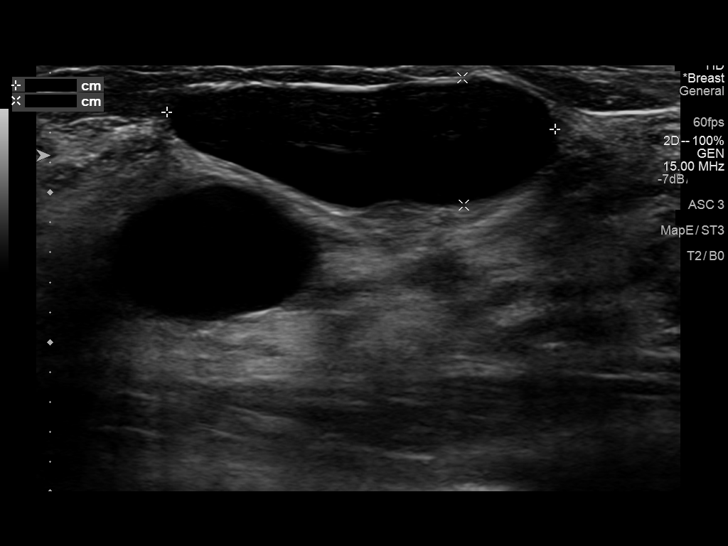
[im 8/10]
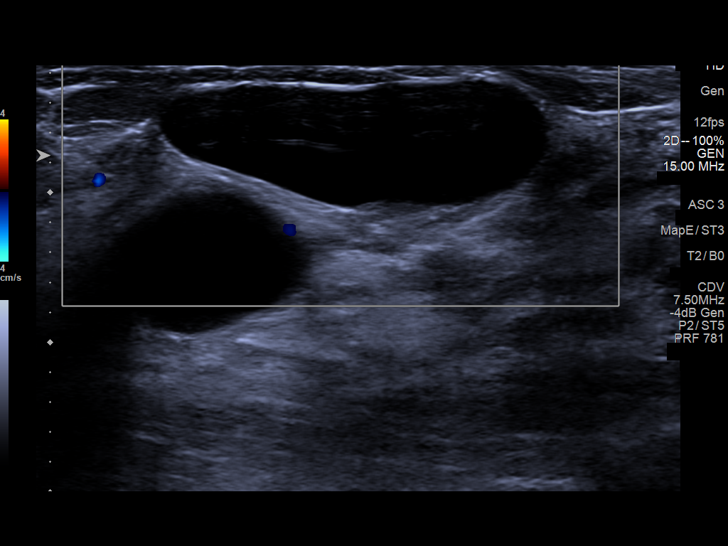
[im 9/10]
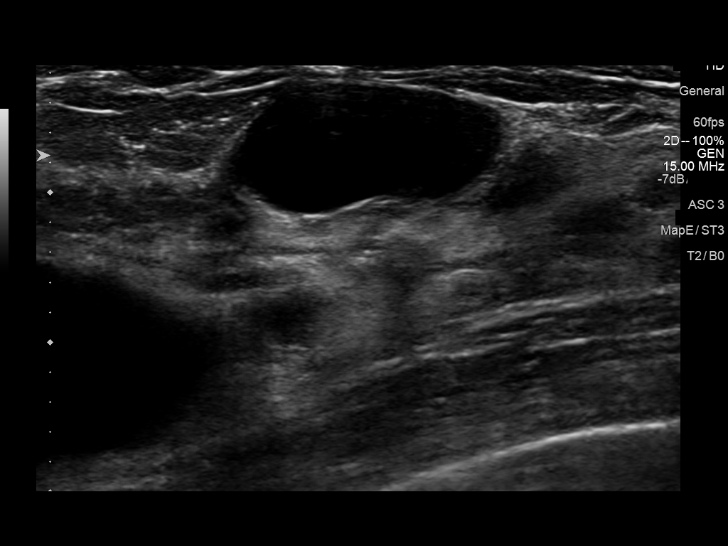
[im 10/10]
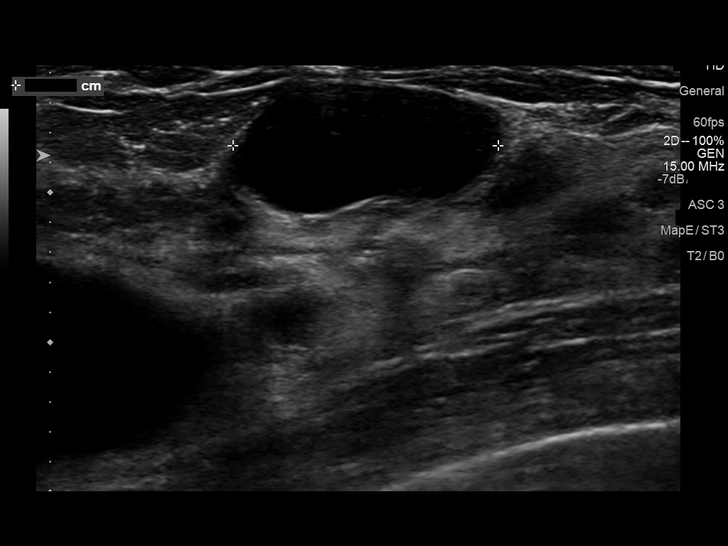

[10 of 10 positions shown; findings below may reference images not displayed]

FINDINGS: On physical exam, I do palpate a smooth and mobile large mass
centered in the 3 o'clock region of the left breast periareolar. At
2 o'clock position left breast 4-5 cm from nipple is a second
smooth, mobile palpable mass.

Targeted ultrasound is performed, showing an oval simple cyst at 3
o'clock position 1 cm from the nipple measuring 4.8 x 1.8 x 3.7 cm.
At 2 o'clock position 5 cm from the nipple is a 2.6 x 0.9 x 1.8 cm
simple cyst. These cysts correspond to the masses seen at
mammography.
IMPRESSION: Two simple cysts identified in the region of concern on recent
screening mammogram. No evidence of malignancy.

RECOMMENDATION:
Screening mammogram in one year.(Code:[id number])

I have discussed the findings and recommendations with the patient.
Results were also provided in writing at the conclusion of the
visit. If applicable, a reminder letter will be sent to the patient
regarding the next appointment.

BI-RADS CATEGORY  2: Benign.

## 2018-04-10 ENCOUNTER — Encounter: Payer: BLUE CROSS/BLUE SHIELD | Admitting: Obstetrics and Gynecology

## 2018-09-08 ENCOUNTER — Telehealth: Payer: Self-pay | Admitting: *Deleted

## 2018-09-08 ENCOUNTER — Telehealth: Payer: Self-pay

## 2018-09-08 DIAGNOSIS — Z20822 Contact with and (suspected) exposure to covid-19: Secondary | ICD-10-CM

## 2018-09-08 NOTE — Telephone Encounter (Signed)
Called patient, message reviewed. She took the Z-pak, missed the last pill noted and not notice a big difference after taking and went to [place] for vacation. She noted that her throat has remained a nagging sore, not severe, and does get allergy symptoms this time of year and takes ceftirizine. She has noted more recently, a little cough and mild congestion, not coughing up stuff, No fevers. She notes her upper back and neck are more achy, with a lot of stresses recent past and wonders if anxiety could be a factor with that. No one else at home ill.  Discussed not likely a bacterial infection concern (really doubt strep noted), and possibly allergies the source of the persistent sore throat and felt best to check a Covid test to rule that out presently and she was ok with this. She will be called with an appt time to get tested (drive thru in [facility]), and treat symptomatically at present with continuing her antihistamine daily, and assess response.  [facility]

## 2018-09-08 NOTE — Telephone Encounter (Signed)
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  Called 08/12/2018 C/O sore throat, ear aches & sinus symptoms.  You prescribed a Zpack for her & she completed it.  Takes Cetirizine intermittently.  They did travel to [place] 2 1/2 - 3 wks ago.  States "wondering if I need a strep test - had strep 2- 3 years ago & the sore throat was awful.  This one doesn't feel that way.  Or do I need COVID testing".   1. Do you have a fever? No 2. Are you having chills? No 3. Do you have a sore throat? Yes 4. Are you experiencing resp S/Sx -cough, SOB? "very little cough - nothing major" 5. Do you have muscle aches? "back pain, but has Hx of back & neck pain" 6. Are you experiencing N/V/D? No 7. Experiencing loss of sense of taste and/or smell? No 8. Do you have a headache? No 9. Have you had contact with a person confirmed Positive for Covid-19? No 10. Have you traveled outside of [place]? Only to [place]

## 2018-09-08 NOTE — Telephone Encounter (Signed)
Towanda Malkin, MD  [facility]        Could you help with getting Tonya Leonard, DOB [birth year] and cell # [phone] a Covid test at the drive through center in [facility].   Thank You,    Shiela Mayer, MD  [facility] (with [place])  [phone]    Pt scheduled for covid testing 09/09/18 @ [facility] @ 1:30pm. Instructions given and order placed

## 2018-09-09 ENCOUNTER — Other Ambulatory Visit: Payer: BLUE CROSS/BLUE SHIELD

## 2018-09-09 DIAGNOSIS — R6889 Other general symptoms and signs: Secondary | ICD-10-CM | POA: Diagnosis not present

## 2018-09-09 DIAGNOSIS — Z20822 Contact with and (suspected) exposure to covid-19: Secondary | ICD-10-CM

## 2018-09-13 LAB — NOVEL CORONAVIRUS, NAA: SARS-CoV-2, NAA: NOT DETECTED

## 2019-01-01 DIAGNOSIS — Z1231 Encounter for screening mammogram for malignant neoplasm of breast: Secondary | ICD-10-CM | POA: Diagnosis not present

## 2019-01-01 DIAGNOSIS — Z9289 Personal history of other medical treatment: Secondary | ICD-10-CM | POA: Diagnosis not present

## 2019-05-28 ENCOUNTER — Encounter: Payer: Self-pay | Admitting: Family Medicine

## 2019-05-28 ENCOUNTER — Ambulatory Visit (INDEPENDENT_AMBULATORY_CARE_PROVIDER_SITE_OTHER): Payer: 59 | Admitting: Family Medicine

## 2019-05-28 ENCOUNTER — Other Ambulatory Visit: Payer: Self-pay

## 2019-05-28 VITALS — BP 119/76 | HR 72 | Wt 160.2 lb

## 2019-05-28 DIAGNOSIS — N939 Abnormal uterine and vaginal bleeding, unspecified: Secondary | ICD-10-CM | POA: Diagnosis not present

## 2019-05-28 DIAGNOSIS — R69 Illness, unspecified: Secondary | ICD-10-CM | POA: Diagnosis not present

## 2019-05-28 DIAGNOSIS — Z1151 Encounter for screening for human papillomavirus (HPV): Secondary | ICD-10-CM | POA: Diagnosis not present

## 2019-05-28 DIAGNOSIS — M25541 Pain in joints of right hand: Secondary | ICD-10-CM

## 2019-05-28 DIAGNOSIS — Z01411 Encounter for gynecological examination (general) (routine) with abnormal findings: Secondary | ICD-10-CM

## 2019-05-28 DIAGNOSIS — M25542 Pain in joints of left hand: Secondary | ICD-10-CM

## 2019-05-28 DIAGNOSIS — Z124 Encounter for screening for malignant neoplasm of cervix: Secondary | ICD-10-CM

## 2019-05-28 DIAGNOSIS — Z803 Family history of malignant neoplasm of breast: Secondary | ICD-10-CM | POA: Diagnosis not present

## 2019-05-28 DIAGNOSIS — B001 Herpesviral vesicular dermatitis: Secondary | ICD-10-CM | POA: Diagnosis not present

## 2019-05-28 MED ORDER — VALACYCLOVIR HCL 1 G PO TABS: 500.0000 mg | ORAL_TABLET | Freq: Every day | ORAL | 2 refills | Status: DC

## 2019-05-28 NOTE — Patient Instructions (Signed)
Preventive Care 21-46 Years Old, Female Preventive care refers to visits with your health care provider and lifestyle choices that can promote health and wellness. This includes:  A yearly physical exam. This may also be called an annual well check.  Regular dental visits and eye exams.  Immunizations.  Screening for certain conditions.  Healthy lifestyle choices, such as eating a healthy diet, getting regular exercise, not using drugs or products that contain nicotine and tobacco, and limiting alcohol use. What can I expect for my preventive care visit? Physical exam Your health care provider will check your:  Height and weight. This may be used to calculate body mass index (BMI), which tells if you are at a healthy weight.  Heart rate and blood pressure.  Skin for abnormal spots. Counseling Your health care provider may ask you questions about your:  Alcohol, tobacco, and drug use.  Emotional well-being.  Home and relationship well-being.  Sexual activity.  Eating habits.  Work and work environment.  Method of birth control.  Menstrual cycle.  Pregnancy history. What immunizations do I need?  Influenza (flu) vaccine  This is recommended every year. Tetanus, diphtheria, and pertussis (Tdap) vaccine  You may need a Td booster every 10 years. Varicella (chickenpox) vaccine  You may need this if you have not been vaccinated. Human papillomavirus (HPV) vaccine  If recommended by your health care provider, you may need three doses over 6 months. Measles, mumps, and rubella (MMR) vaccine  You may need at least one dose of MMR. You may also need a second dose. Meningococcal conjugate (MenACWY) vaccine  One dose is recommended if you are age 19-21 years and a first-year college student living in a residence hall, or if you have one of several medical conditions. You may also need additional booster doses. Pneumococcal conjugate (PCV13) vaccine  You may need  this if you have certain conditions and were not previously vaccinated. Pneumococcal polysaccharide (PPSV23) vaccine  You may need one or two doses if you smoke cigarettes or if you have certain conditions. Hepatitis A vaccine  You may need this if you have certain conditions or if you travel or work in places where you may be exposed to hepatitis A. Hepatitis B vaccine  You may need this if you have certain conditions or if you travel or work in places where you may be exposed to hepatitis B. Haemophilus influenzae type b (Hib) vaccine  You may need this if you have certain conditions. You may receive vaccines as individual doses or as more than one vaccine together in one shot (combination vaccines). Talk with your health care provider about the risks and benefits of combination vaccines. What tests do I need?  Blood tests  Lipid and cholesterol levels. These may be checked every 5 years starting at age 20.  Hepatitis C test.  Hepatitis B test. Screening  Diabetes screening. This is done by checking your blood sugar (glucose) after you have not eaten for a while (fasting).  Sexually transmitted disease (STD) testing.  BRCA-related cancer screening. This may be done if you have a family history of breast, ovarian, tubal, or peritoneal cancers.  Pelvic exam and Pap test. This may be done every 3 years starting at age 21. Starting at age 30, this may be done every 5 years if you have a Pap test in combination with an HPV test. Talk with your health care provider about your test results, treatment options, and if necessary, the need for more tests.   Follow these instructions at home: Eating and drinking   Eat a diet that includes fresh fruits and vegetables, whole grains, lean protein, and low-fat dairy.  Take vitamin and mineral supplements as recommended by your health care provider.  Do not drink alcohol if: ? Your health care provider tells you not to drink. ? You are  pregnant, may be pregnant, or are planning to become pregnant.  If you drink alcohol: ? Limit how much you have to 0-1 drink a day. ? Be aware of how much alcohol is in your drink. In the [place] one drink equals one 12 oz bottle of beer (355 mL), one 5 oz glass of wine (148 mL), or one 1 oz glass of hard liquor (44 mL). Lifestyle  Take daily care of your teeth and gums.  Stay active. Exercise for at least 30 minutes on 5 or more days each week.  Do not use any products that contain nicotine or tobacco, such as cigarettes, e-cigarettes, and chewing tobacco. If you need help quitting, ask your health care provider.  If you are sexually active, practice safe sex. Use a condom or other form of birth control (contraception) in order to prevent pregnancy and STIs (sexually transmitted infections). If you plan to become pregnant, see your health care provider for a preconception visit. What's next?  Visit your health care provider once a year for a well check visit.  Ask your health care provider how often you should have your eyes and teeth checked.  Stay up to date on all vaccines. This information is not intended to replace advice given to you by your health care provider. Make sure you discuss any questions you have with your health care provider. Document Revised: 10/31/2017 Document Reviewed: 10/31/2017 Elsevier Patient Education  2020 [employer].

## 2019-05-28 NOTE — Progress Notes (Signed)
Patient reports some bloating and cycle concerns. Mammogram 07/2016? Pap smear ?

## 2019-05-28 NOTE — Progress Notes (Signed)
Subjective:     Tonya Leonard is a 44 y.o. female and is here for a comprehensive physical exam. The patient reports problems - bleeding and pain. Cycles are occurring 2x/month, skipping months, etc. Has long h/o excessive bleeding, Husband has had a vasectomy. Reports some GI disturbance.Reports pressure in her lower abdomen. Denies constipation or blood in stool. Having painful joints in her thumbs x a few months. No other affected joints. Mother with h/o psoriatic arthritis, but no skin issues. Reports working out and weight loss a few years ago and feeling great, now working full time and having trouble with being in as great a shape as she was. 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 Recurrent fever blisters, related to sun exposure. Uses Valtrex prn. Has long h/o mulltiple cysts on breasts with biopsies and cyst drainage. Last mammogram 12/2018 and stable.  The following portions of the patient's history were reviewed and updated as appropriate: allergies, current medications, past family history, past medical history, past social history, past surgical history and problem list.  Review of Systems Pertinent items noted in HPI and remainder of comprehensive ROS otherwise negative.   Objective:    BP 119/76   Pulse 72   Wt 160 lb 3.2 oz (72.7 kg)   LMP 05/07/2019   BMI 22.99 kg/m  General appearance: alert, cooperative and appears stated age Head: Normocephalic, without obvious abnormality, atraumatic Neck: no adenopathy, supple, symmetrical, trachea midline and thyroid not enlarged, symmetric, no tenderness/mass/nodules Lungs: clear to auscultation bilaterally Breasts: multiple mobile masses, including one on left which is about 1.5 cm x 1.5 cm and a smaller one on the right. Fibrocystic change noted throughout. Heart: regular rate and rhythm, S1, S2 normal, no murmur, click, rub or gallop Abdomen: soft, non-tender; bowel sounds normal; no masses,  no organomegaly Pelvic: cervix  normal in appearance, external genitalia normal, no adnexal masses or tenderness, no cervical motion tenderness, uterus normal size, shape, and consistency and vagina normal without discharge Extremities: extremities normal, atraumatic, no cyanosis or edema Pulses: 2+ and symmetric Skin: Skin color, texture, turgor normal. No rashes or lesions Lymph nodes: Cervical, supraclavicular, and axillary nodes normal. Neurologic: Grossly normal    Assessment:    Healthy female exam.      Plan:      Problem List Items Addressed This Visit      Unprioritized   Abnormal uterine bleeding    Will assess pelvic sonogram and check TSH and FSH to see if she is getting into menopause.      Relevant Orders   US PELVIC COMPLETE WITH TRANSVAGINAL   Fever blister    Valtrex 1/2 tab daily as preventive. Increase to full tab twice daily for treatment of outbreak.      Relevant Medications   valACYclovir (VALTREX) 1000 MG tablet   Joint pain    Trial of NSAIDS--may need further w/u with PCP      Family history of breast cancer    MGF with it, and several maternal aunts, mom is not a carrier of the BRCA gene, though it runs in the family.       Other Visit Diagnoses    Encounter for gynecological examination with abnormal finding    -  Primary   Relevant Orders   CBC (Completed)   TSH (Completed)   Follicle stimulating hormone (Completed)   Comprehensive metabolic panel (Completed)   VITAMIN D 25 Hydroxy (Vit-D Deficiency, Fractures) (Completed)   Lipid panel (Completed)   Screening  for cervical cancer       Relevant Orders   Cytology - PAP( [facility])     Return in 1 year (on 05/27/2020).  See After Visit Summary for Counseling Recommendations

## 2019-05-29 ENCOUNTER — Encounter: Payer: Self-pay | Admitting: Family Medicine

## 2019-05-29 DIAGNOSIS — B001 Herpesviral vesicular dermatitis: Secondary | ICD-10-CM | POA: Insufficient documentation

## 2019-05-29 DIAGNOSIS — N939 Abnormal uterine and vaginal bleeding, unspecified: Secondary | ICD-10-CM | POA: Insufficient documentation

## 2019-05-29 DIAGNOSIS — M2550 Pain in unspecified joint: Secondary | ICD-10-CM | POA: Insufficient documentation

## 2019-05-29 DIAGNOSIS — Z803 Family history of malignant neoplasm of breast: Secondary | ICD-10-CM | POA: Insufficient documentation

## 2019-05-29 LAB — COMPREHENSIVE METABOLIC PANEL
ALT: 11 IU/L (ref 0–32)
AST: 15 IU/L (ref 0–40)
Albumin/Globulin Ratio: 2 (ref 1.2–2.2)
Albumin: 4.9 g/dL — ABNORMAL HIGH (ref 3.8–4.8)
Alkaline Phosphatase: 78 IU/L (ref 39–117)
BUN/Creatinine Ratio: 26 — ABNORMAL HIGH (ref 9–23)
BUN: 16 mg/dL (ref 6–24)
Bilirubin Total: 0.5 mg/dL (ref 0.0–1.2)
CO2: 23 mmol/L (ref 20–29)
Calcium: 9.3 mg/dL (ref 8.7–10.2)
Chloride: 102 mmol/L (ref 96–106)
Creatinine, Ser: 0.61 mg/dL (ref 0.57–1.00)
GFR calc Af Amer: 127 mL/min/{1.73_m2} (ref >59)
GFR calc non Af Amer: 110 mL/min/{1.73_m2} (ref >59)
Globulin, Total: 2.4 g/dL (ref 1.5–4.5)
Glucose: 99 mg/dL (ref 65–99)
Potassium: 3.8 mmol/L (ref 3.5–5.2)
Sodium: 138 mmol/L (ref 134–144)
Total Protein: 7.3 g/dL (ref 6.0–8.5)

## 2019-05-29 LAB — CBC
Hematocrit: 40.1 % (ref 34.0–46.6)
Hemoglobin: 13.2 g/dL (ref 11.1–15.9)
MCH: 31.7 pg (ref 26.6–33.0)
MCHC: 32.9 g/dL (ref 31.5–35.7)
MCV: 96 fL (ref 79–97)
Platelets: 195 10*3/uL (ref 150–450)
RBC: 4.16 x10E6/uL (ref 3.77–5.28)
RDW: 13.1 % (ref 11.7–15.4)
WBC: 8.4 10*3/uL (ref 3.4–10.8)

## 2019-05-29 LAB — LIPID PANEL
Chol/HDL Ratio: 2.8 ratio (ref 0.0–4.4)
Cholesterol, Total: 184 mg/dL (ref 100–199)
HDL: 65 mg/dL (ref >39)
LDL Chol Calc (NIH): 103 mg/dL — ABNORMAL HIGH (ref 0–99)
Triglycerides: 87 mg/dL (ref 0–149)
VLDL Cholesterol Cal: 16 mg/dL (ref 5–40)

## 2019-05-29 LAB — TSH: TSH: 1.75 u[IU]/mL (ref 0.450–4.500)

## 2019-05-29 LAB — VITAMIN D 25 HYDROXY (VIT D DEFICIENCY, FRACTURES): Vit D, 25-Hydroxy: 42.5 ng/mL (ref 30.0–100.0)

## 2019-05-29 LAB — FOLLICLE STIMULATING HORMONE: FSH: 9.2 m[IU]/mL

## 2019-05-29 NOTE — Assessment & Plan Note (Signed)
MGF with it, and several maternal aunts, mom is not a carrier of the BRCA gene, though it runs in the family.

## 2019-05-29 NOTE — Assessment & Plan Note (Signed)
Will assess pelvic sonogram and check TSH and FSH to see if she is getting into menopause.

## 2019-05-29 NOTE — Assessment & Plan Note (Signed)
Trial of NSAIDS--may need further w/u with PCP

## 2019-05-29 NOTE — Assessment & Plan Note (Signed)
Valtrex 1/2 tab daily as preventive. Increase to full tab twice daily for treatment of outbreak.

## 2019-06-02 ENCOUNTER — Telehealth: Payer: Self-pay

## 2019-06-02 LAB — CYTOLOGY - PAP
Comment: NEGATIVE
Diagnosis: NEGATIVE
High risk HPV: NEGATIVE

## 2019-06-02 NOTE — Telephone Encounter (Signed)
Received office notes from Darron Doom, MD of [facility] at [facility] dated 05/28/19.  Reviewed by Randel Pigg, PA-C (Interim Provider) on 06/02/19.  AMD

## 2019-06-04 ENCOUNTER — Ambulatory Visit: Payer: 59

## 2019-07-30 ENCOUNTER — Other Ambulatory Visit: Payer: Self-pay

## 2019-07-30 MED ORDER — TRANEXAMIC ACID 650 MG PO TABS: 650.0000 mg | ORAL_TABLET | Freq: Three times a day (TID) | ORAL | 3 refills | Status: DC

## 2019-07-30 NOTE — Telephone Encounter (Signed)
Patient would like refill on lysteda 650mg .

## 2019-10-19 ENCOUNTER — Ambulatory Visit: Payer: Self-pay | Admitting: Emergency Medicine

## 2019-10-19 ENCOUNTER — Other Ambulatory Visit: Payer: Self-pay

## 2019-10-19 ENCOUNTER — Encounter: Payer: Self-pay | Admitting: Emergency Medicine

## 2019-10-19 VITALS — BP 138/78 | HR 91 | Temp 97.1°F | Resp 16 | Ht 71.0 in | Wt 156.0 lb

## 2019-10-19 DIAGNOSIS — J328 Other chronic sinusitis: Secondary | ICD-10-CM

## 2019-10-19 MED ORDER — AZITHROMYCIN 250 MG PO TABS: ORAL_TABLET | ORAL | 0 refills | Status: DC

## 2019-10-19 MED ORDER — PREDNISONE 50 MG PO TABS: ORAL_TABLET | ORAL | 0 refills | Status: DC

## 2019-10-19 NOTE — Progress Notes (Signed)
  Occupational Health Provider Note       Time seen: 2:32 PM    I have reviewed the vital signs and the nursing notes.  HISTORY   Chief Complaint Sinusitis    HPI Tonya Leonard is a 44 y.o. female with no significant past medical history who presents today for sinus pressure that she has had for the last month.  She has tried over-the-counter medications without any improvement.  Typically has 2 sinus infections a year.  Past Medical History:  Diagnosis Date  . Breast mass 2013   right breast  . Fibrocystic breast changes of both breasts     Past Surgical History:  Procedure Laterality Date  . BREAST BIOPSY Right 12/2011  . BREAST CYST ASPIRATION Left     Allergies Latex   Review of Systems Constitutional: Negative for fever. HEENT exam: Positive for sinus pressure Cardiovascular: Negative for chest pain. Respiratory: Negative for shortness of breath. Gastrointestinal: Negative for abdominal pain, vomiting and diarrhea. Skin: Negative for rash. Neurological: Negative for headaches, focal weakness or numbness.  All systems negative/normal/unremarkable except as stated in the HPI  ____________________________________________   PHYSICAL EXAM:  VITAL SIGNS: Vitals:   10/19/19 1412  BP: 138/78  Pulse: 91  Resp: 16  Temp: (!) 97.1 F (36.2 C)  SpO2: 98%    Constitutional: Alert and oriented. Well appearing and in no distress. Eyes: Conjunctivae are normal. Normal extraocular movements. ENT      Head: Normocephalic and atraumatic.  TMs are clear      Nose: No congestion/rhinnorhea.      Mouth/Throat: Mucous membranes are moist.      Neck: No stridor. Cardiovascular: Normal rate, regular rhythm. No murmurs, rubs, or gallops. Respiratory: Normal respiratory effort without tachypnea nor retractions. Breath sounds are clear and equal bilaterally. No wheezes/rales/rhonchi. Musculoskeletal: Nontender with normal range of motion in extremities. No lower  extremity tenderness nor edema. Neurologic:  Normal speech and language. No gross focal neurologic deficits are appreciated.  Skin:  Skin is warm, dry and intact. No rash noted.  ____________________________________________   LABS (pertinent positives/negatives)  No results found for this or any previous visit (from the past 2160 hour(s)).   ASSESSMENT AND PLAN  Sinusitis   Plan: The patient had presented for subacute sinusitis.  Patient will be placed on prednisone as well as a Z-Pak.  I have consider referring her to ENT because of twice yearly sinus infection for many years but she has declined at this time.  Lenise Arena MD    Note: This note was generated in part or whole with voice recognition software. Voice recognition is usually quite accurate but there are transcription errors that can and very often do occur. I apologize for any typographical errors that were not detected and corrected.

## 2019-10-19 NOTE — Progress Notes (Signed)
Pt has had a sinus infection for about a month.

## 2019-10-26 DIAGNOSIS — Z20822 Contact with and (suspected) exposure to covid-19: Secondary | ICD-10-CM | POA: Diagnosis not present

## 2019-10-26 DIAGNOSIS — Z03818 Encounter for observation for suspected exposure to other biological agents ruled out: Secondary | ICD-10-CM | POA: Diagnosis not present

## 2019-10-27 ENCOUNTER — Telehealth: Payer: Self-pay | Admitting: Emergency Medicine

## 2019-10-27 MED ORDER — IVERMECTIN 3 MG PO TABS: 200.0000 ug/kg | ORAL_TABLET | Freq: Every day | ORAL | 0 refills | Status: AC

## 2019-10-27 NOTE — Telephone Encounter (Signed)
Called in ivermectin

## 2019-10-29 ENCOUNTER — Telehealth: Payer: Self-pay | Admitting: *Deleted

## 2019-10-29 NOTE — Telephone Encounter (Signed)
Pt called with questions and concerns. Pt currently has Covid and had started her cycle prior too, but it has now lasted 17 days and today she had passed several golf ball sized clots. Pt had stopped her Lysteda due to having covid due the unknown risks. Pt wants to know is it ok to take the lysteda. Dr Kennon Rounds informed about pt and her situation. Advised that pt should try and wait till day 10 of Covid before starting back the Lysteda due to increased risk of blood clots. Also advised to start taking an iron supplement every other day. Advised pt that if she becomes SOB, any chest pain or bleeding gets worse she should go to ED. Pt verbalizes and understands.

## 2019-10-31 ENCOUNTER — Other Ambulatory Visit: Payer: Self-pay

## 2019-10-31 ENCOUNTER — Encounter: Payer: Self-pay | Admitting: Emergency Medicine

## 2019-10-31 DIAGNOSIS — Z79899 Other long term (current) drug therapy: Secondary | ICD-10-CM | POA: Insufficient documentation

## 2019-10-31 DIAGNOSIS — N921 Excessive and frequent menstruation with irregular cycle: Secondary | ICD-10-CM | POA: Diagnosis not present

## 2019-10-31 DIAGNOSIS — N938 Other specified abnormal uterine and vaginal bleeding: Secondary | ICD-10-CM | POA: Diagnosis not present

## 2019-10-31 DIAGNOSIS — N920 Excessive and frequent menstruation with regular cycle: Secondary | ICD-10-CM | POA: Insufficient documentation

## 2019-10-31 DIAGNOSIS — N939 Abnormal uterine and vaginal bleeding, unspecified: Secondary | ICD-10-CM | POA: Insufficient documentation

## 2019-10-31 DIAGNOSIS — Z91040 Latex allergy status: Secondary | ICD-10-CM | POA: Insufficient documentation

## 2019-10-31 LAB — COMPREHENSIVE METABOLIC PANEL
ALT: 15 U/L (ref 0–44)
AST: 20 U/L (ref 15–41)
Albumin: 3.6 g/dL (ref 3.5–5.0)
Alkaline Phosphatase: 66 U/L (ref 38–126)
Anion gap: 5 (ref 5–15)
BUN: 18 mg/dL (ref 6–20)
CO2: 28 mmol/L (ref 22–32)
Calcium: 8.5 mg/dL — ABNORMAL LOW (ref 8.9–10.3)
Chloride: 103 mmol/L (ref 98–111)
Creatinine, Ser: 0.68 mg/dL (ref 0.44–1.00)
GFR calc Af Amer: >60 mL/min (ref >60)
GFR calc non Af Amer: >60 mL/min (ref >60)
Glucose, Bld: 165 mg/dL — ABNORMAL HIGH (ref 70–99)
Potassium: 3.8 mmol/L (ref 3.5–5.1)
Sodium: 136 mmol/L (ref 135–145)
Total Bilirubin: 0.5 mg/dL (ref 0.3–1.2)
Total Protein: 7 g/dL (ref 6.5–8.1)

## 2019-10-31 LAB — CBC
HCT: 30.4 % — ABNORMAL LOW (ref 36.0–46.0)
Hemoglobin: 10.6 g/dL — ABNORMAL LOW (ref 12.0–15.0)
MCH: 31.4 pg (ref 26.0–34.0)
MCHC: 34.9 g/dL (ref 30.0–36.0)
MCV: 89.9 fL (ref 80.0–100.0)
Platelets: 140 10*3/uL — ABNORMAL LOW (ref 150–400)
RBC: 3.38 MIL/uL — ABNORMAL LOW (ref 3.87–5.11)
RDW: 13.7 % (ref 11.5–15.5)
WBC: 2.9 10*3/uL — ABNORMAL LOW (ref 4.0–10.5)
nRBC: 0 % (ref 0.0–0.2)

## 2019-10-31 NOTE — ED Triage Notes (Signed)
Patient states that she is covid positive. Patient states that she has been having her period for 20 days. Patient states that over the last 3 days it has been heavier and passing large clots. Patient states that she normally takes Tranexamic acid for her periods but that her MD told her not to take it since she is covid positive.

## 2019-11-01 ENCOUNTER — Emergency Department: Payer: 59

## 2019-11-01 ENCOUNTER — Emergency Department
Admission: EM | Admit: 2019-11-01 | Discharge: 2019-11-01 | Disposition: A | Discharge status: Home / Self Care | Payer: 59 | Attending: Emergency Medicine | Admitting: Emergency Medicine

## 2019-11-01 DIAGNOSIS — N939 Abnormal uterine and vaginal bleeding, unspecified: Secondary | ICD-10-CM

## 2019-11-01 DIAGNOSIS — N920 Excessive and frequent menstruation with regular cycle: Secondary | ICD-10-CM

## 2019-11-01 LAB — HCG, QUANTITATIVE, PREGNANCY: hCG, Beta Chain, Quant, S: <1 m[IU]/mL (ref <5)

## 2019-11-01 MED ORDER — KETOROLAC TROMETHAMINE 30 MG/ML IJ SOLN
30.0000 mg | Freq: Once | INTRAMUSCULAR | Status: AC
  Administered 2019-11-01: 30 mg via INTRAMUSCULAR
  Filled 2019-11-01: qty 1

## 2019-11-01 NOTE — ED Notes (Signed)
Pt to desk to c/o continued vaginal bleeding. Pt provided with pads by this RN, this RN apologized for continued wait, pt states understanding at this time.

## 2019-11-01 NOTE — ED Provider Notes (Signed)
[facility] Emergency Department Provider Note ____________________________________________   First MD Initiated Contact with Patient 11/01/19 1019     (approximate)  I have reviewed the triage vital signs and the nursing notes.  HISTORY  Chief Complaint Vaginal Bleeding   HPI Tonya Leonard is a 44 y.o. femalewho presents to the ED for evaluation of vaginal bleeding.  Chart review indicates 05/2019 OB/GYN establishment with Dr. Kennon Rounds.  History of menorrhagia.  Patient reports being prescribed oral TXA that she takes with her menstrual periods due to the severity of her menorrhagia.  Patient reports being diagnosed for COVID-19 about 2 weeks ago, and in discussion with her OB/GYN has not taken her regular oral TXA during her current menstrual period.  Patient reports improving COVID-19 symptoms with improving dyspnea and nonproductive cough.  Denies subsequent productive cough and she no longer is experiencing high spiking fevers.  Patient reports being on her menstrual period for about 20 days now and reports "it is very bad."  She indicates passage of large clots the size of her fist, continued vaginal bleeding and soaking through about 1 pad per hour when the bleeding is at its worst.  She reports bleeding through her pants at home last night, which precipitated her presentation to the ED for evaluation.  She does report presyncopal dizziness while standing that resolves after a matter of seconds without syncope.  Denies dizziness at rest.  Denies chest pain.     Past Medical History:  Diagnosis Date  . Breast mass 2013   right breast  . Fibrocystic breast changes of both breasts     Patient Active Problem List   Diagnosis Date Noted  . Abnormal uterine bleeding 05/29/2019  . Fever blister 05/29/2019  . Joint pain 05/29/2019  . Family history of breast cancer 05/29/2019  . Abnormal finding on mammography 07/17/2016  . Vitamin D deficiency 01/13/2016   . Breast cyst 07/12/2013  . Breast mass 07/12/2013    Past Surgical History:  Procedure Laterality Date  . BREAST BIOPSY Right 12/2011  . BREAST CYST ASPIRATION Left     Prior to Admission medications   Medication Sig Start Date End Date Taking? Authorizing Provider  azithromycin (ZITHROMAX Z-PAK) 250 MG tablet Dispense 1 Z-Pak as directed 10/19/19  Yes Earleen Newport, MD  cholecalciferol (VITAMIN D) 1000 units tablet Take 1 tablet (1,000 Units total) by mouth daily. 04/12/17  Yes Rubie Maid, MD  ferrous sulfate 325 (65 FE) MG tablet Take 325 mg by mouth daily with breakfast.   Yes [provider]  predniSONE (DELTASONE) 50 MG tablet Take 1 tablet by mouth daily 10/19/19  Yes Earleen Newport, MD  tranexamic acid (LYSTEDA) 650 MG TABS tablet Take 1 tablet (650 mg total) by mouth 3 (three) times daily. 07/30/19  Yes Donnamae Jude, MD  ivermectin (STROMECTOL) 3 MG TABS tablet Take 4.5 tablets (13,500 mcg total) by mouth daily for 5 days. Patient not taking: Reported on 11/01/2019 10/27/19 11/01/19  Earleen Newport, MD  valACYclovir (VALTREX) 1000 MG tablet Take 0.5 tablets (500 mg total) by mouth daily. For prevention, increase to 1 tab twice daily for outbreak 05/28/19   Donnamae Jude, MD    Allergies Latex  Family History  Problem Relation Age of Onset  . Cancer Maternal Aunt 60       breast  . Breast cancer Maternal Aunt   . Cancer Maternal Aunt 43       breast  .  Breast cancer Maternal Aunt   . Cancer Maternal Grandfather 40       breast   . Breast cancer Maternal Grandfather     Social History Social History   Tobacco Use  . Smoking status: Never Smoker  . Smokeless tobacco: Never Used  Vaping Use  . Vaping Use: Never used  Substance Use Topics  . Alcohol use: Yes    Comment: occas  . Drug use: No    Review of Systems  Constitutional: No fever/chills Eyes: No visual changes. ENT: No sore throat. Cardiovascular: Denies chest  pain. Respiratory: Positive for shortness of breath and nonproductive cough. Gastrointestinal: No abdominal pain.  No nausea, no vomiting.  No diarrhea.  No constipation. Genitourinary: Negative for dysuria.  Positive for menorrhagia Musculoskeletal: Negative for back pain. Skin: Negative for rash. Neurological: Negative for headaches, focal weakness or numbness.   ____________________________________________   PHYSICAL EXAM:  VITAL SIGNS: Vitals:   11/01/19 1130 11/01/19 1330  BP: 107/77 98/76  Pulse: 91 90  Resp: 18 16  Temp:    SpO2: 100% 100%     Constitutional: Alert and oriented. Well appearing and in no acute distress. Eyes: Conjunctivae are normal. PERRL. EOMI. Head: Atraumatic. Nose: No congestion/rhinnorhea. Mouth/Throat: Mucous membranes are moist.  Oropharynx non-erythematous. Neck: No stridor. No cervical spine tenderness to palpation. Cardiovascular: Normal rate, regular rhythm. Grossly normal heart sounds.  Good peripheral circulation. Respiratory: Normal respiratory effort.  No retractions. Lungs CTAB. Gastrointestinal: Soft , nondistended, nontender to palpation. No abdominal bruits. No CVA tenderness. Musculoskeletal: No lower extremity tenderness nor edema.  No joint effusions. No signs of acute trauma. Neurologic:  Normal speech and language. No gross focal neurologic deficits are appreciated. No gait instability noted. Skin:  Skin is warm, dry and intact. No rash noted. Psychiatric: Mood and affect are normal. Speech and behavior are normal.  ____________________________________________   LABS (all labs ordered are listed, but only abnormal results are displayed)  Labs Reviewed  COMPREHENSIVE METABOLIC PANEL - Abnormal; Notable for the following components:      Result Value   Glucose, Bld 165 (*)    Calcium 8.5 (*)    All other components within normal limits  CBC - Abnormal; Notable for the following components:   WBC 2.9 (*)    RBC 3.38 (*)     Hemoglobin 10.6 (*)    HCT 30.4 (*)    Platelets 140 (*)    All other components within normal limits  HCG, QUANTITATIVE, PREGNANCY   ____________________________________________  RADIOLOGY  ED MD interpretation: Ultrasound reviewed with evidence of mildly thickened endometrium  Official radiology report(s): US PELVIC COMPLETE W TRANSVAGINAL AND TORSION R/O  Result Date: 11/01/2019 CLINICAL DATA:  Initial evaluation for heavy vaginal bleeding. EXAM: TRANSABDOMINAL AND TRANSVAGINAL ULTRASOUND OF PELVIS DOPPLER ULTRASOUND OF OVARIES TECHNIQUE: Both transabdominal and transvaginal ultrasound examinations of the pelvis were performed. Transabdominal technique was performed for global imaging of the pelvis including uterus, ovaries, adnexal regions, and pelvic cul-de-sac. It was necessary to proceed with endovaginal exam following the transabdominal exam to visualize the uterus, endometrium, and ovaries. Color and duplex Doppler ultrasound was utilized to evaluate blood flow to the ovaries. COMPARISON:  None. FINDINGS: Uterus Measurements: 9.3 x 5.1 x 6.3 cm = volume: 156 mL. Uterus is anteverted. 7 x 5 x 7 mm intramural fibroid present at the posterior uterine fundus. Few small nabothian cysts noted about the cervix. Endometrium Thickness: 7 mm.  No focal abnormality visualized. Right ovary Measurements: 3.6 x  1.7 x 1.7 cm = volume: 5.4 mL. 1.3 x 1.5 x 1.5 cm simple cyst noted, most consistent with a normal physiologic follicular cyst/dominant follicle. No other adnexal mass. Left ovary Measurements: 3.1 x 2.7 x 3.5 cm = volume: 15.3 mL. 3.2 x 2.6 x 2.6 cm hypoechoic cystic lesion with homogeneous low-level internal echoes. No appreciable internal vascularity or solid nodularity. Pulsed Doppler evaluation of both ovaries demonstrates normal low-resistance arterial and venous waveforms. Other findings No abnormal free fluid. IMPRESSION: 1. Endometrial stripe measures 7 mm in thickness. If bleeding  remains unresponsive to hormonal or medical therapy, sonohysterogram should be considered for focal lesion work-up. (Ref: Radiological Reasoning: Algorithmic Workup of Abnormal Vaginal Bleeding with Endovaginal Sonography and Sonohysterography. AJR 2008; 518:A41-66). 2. 7 mm intramural fibroid at the posterior uterine fundus. Otherwise normal sonographic appearance of the uterus. 3. 3.2 cm hypoechoic left ovarian cystic lesion. Findings indeterminate, with primary differential considerations including a hemorrhagic cyst versus endometrioma. Short interval follow-up ultrasound in 8-12 weeks suggested for further evaluation. 4. No evidence for ovarian torsion. Electronically Signed   By: Jeannine Boga M.D.   On: 11/01/2019 01:52    ____________________________________________   PROCEDURES and INTERVENTIONS  Procedure(s) performed (including Critical Care):  Procedures  Medications  ketorolac (TORADOL) 30 MG/ML injection 30 mg (30 mg Intramuscular Given 11/01/19 1305)    ____________________________________________   MDM / ED COURSE  44 year old woman with history of menorrhagia presenting with severe episode of this, with evidence of slight decrement in her hemoglobin, but amenable to outpatient management with OB/GYN follow-up.  Normal vital signs on room air without tachycardia or hypoxia.  Reassuring examination with a benign abdomen, and she is without evidence of acute pathology.  She has no distress or signs of trauma.  Blood work demonstrates a slight decrease in her hemoglobin to 10.5 from 13, otherwise blood work is unremarkable.  Transvaginal ultrasound demonstrates a slightly thickened endometrial stripe and known fibroid.  The thickened medial stripe is likely normal in the setting of her ongoing menstrual period.  Provided patient Toradol with improving symptoms.  Discussed case with Dr. Leafy Ro, OB/GYN on-call, who agrees with initiation of NSAIDs and follow-up with OB/GYN.  We  discussed the utility of restarting her home TXA, which I think is reasonable.  Shared decision making with the patient regarding restarting oral TXA in the setting of COVID-19, we discussed the possibility of blood clots, though it would help with her current vaginal bleeding, and she is agreeable to restart this medication.  We discussed outpatient management and return precautions for the ED, patient medically stable for discharge home.  Clinical Course as of Oct 31 1521  Sun Nov 01, 2019  1105 Spoke with Dr. Leafy Ro She recommends a trial of NSAIDs and consideration of restarting her home TXA, agrees that reasonable to follow-up as an outpatient with OB/GYN.   [DS]  1217 Reassessed. Educated patient that her Toradol and disposition were delayed due to a cardiac arrest patient being roomed next door. Educated patient on my conversation with OB/GYN recommendations to start NSAIDs, and consider TXA.  Shared decision-making conversation with the utility of restarting her TXA in setting of COVID-19 and its administration, she is agreeable to restart this medication due to the severity of her vaginal bleeding.   [DS]    Clinical Course User Index [DS] Vladimir Crofts, MD    ___________________________________________   FINAL CLINICAL IMPRESSION(S) / ED DIAGNOSES  Final diagnoses:  Menorrhagia with regular cycle  Vaginal bleeding  Abnormal  uterine bleeding     ED Discharge Orders    None       [place]   Note:  This document was prepared using Dragon voice recognition software and may include unintentional dictation errors.   Vladimir Crofts, MD 11/01/19 [phone]

## 2019-11-01 NOTE — Discharge Instructions (Addendum)
Please take Tylenol and ibuprofen/Advil for your pain.  It is safe to take them together, or to alternate them every few hours.  Take up to 1000mg  of Tylenol at a time, up to 4 times per day.  Do not take more than 4000 mg of Tylenol in 24 hours.  For ibuprofen, take 400-600 mg, 4-5 times per day.  As we discussed, I think would be reasonable to go ahead and start your tranexamic acid/Lysteda medication back.  Please follow-up with your OB/GYN physician, I would call him tomorrow and be seen as soon as possible.  Ideally within the next 3-5 days.  If you develop any fevers with your symptoms, worsening symptoms despite the above medications, please return to the ED.

## 2019-11-04 DIAGNOSIS — Z1152 Encounter for screening for COVID-19: Secondary | ICD-10-CM | POA: Diagnosis not present

## 2019-11-04 DIAGNOSIS — Z03818 Encounter for observation for suspected exposure to other biological agents ruled out: Secondary | ICD-10-CM | POA: Diagnosis not present

## 2019-11-12 ENCOUNTER — Encounter: Payer: Self-pay | Admitting: Family Medicine

## 2019-11-12 ENCOUNTER — Other Ambulatory Visit: Payer: Self-pay

## 2019-11-12 ENCOUNTER — Ambulatory Visit (INDEPENDENT_AMBULATORY_CARE_PROVIDER_SITE_OTHER): Payer: 59 | Admitting: Family Medicine

## 2019-11-12 VITALS — BP 116/77 | HR 92 | Wt 157.0 lb

## 2019-11-12 DIAGNOSIS — N939 Abnormal uterine and vaginal bleeding, unspecified: Secondary | ICD-10-CM | POA: Diagnosis not present

## 2019-11-12 DIAGNOSIS — N83202 Unspecified ovarian cyst, left side: Secondary | ICD-10-CM | POA: Diagnosis not present

## 2019-11-12 NOTE — Assessment & Plan Note (Signed)
Appears to be endometrioma or hemorrhagic cyst--3.2 cm, will f/u in 8 wks from prior.

## 2019-11-12 NOTE — Progress Notes (Signed)
Follow up from bleeding and ED visit

## 2019-11-12 NOTE — Progress Notes (Signed)
Subjective:    Patient ID: Tonya Leonard is a 44 y.o. female presenting with Follow-up  on 11/12/2019  HPI: Had COVID and then had a super long cycle. Has previously had ongoing menstrual irregularities and some heavy cycles. Using TXA with ok results. Interested in a longer term solution possibly. Thinks this last 22 day cycle with ED visit was related to Covid only. At ED, noted to have Hgb of 10.6 down from 13. Also had pelvic sono which showed left ovarian cyst.  Review of Systems  Constitutional: Negative for chills and fever.  Respiratory: Negative for shortness of breath.   Cardiovascular: Negative for chest pain.  Gastrointestinal: Negative for abdominal pain, nausea and vomiting.  Genitourinary: Negative for dysuria.  Skin: Negative for rash.      Objective:    BP 116/77   Pulse 92   Wt 157 lb (71.2 kg)   LMP 10/19/2019   BMI 21.90 kg/m  Physical Exam Constitutional:      General: She is not in acute distress.    Appearance: She is well-developed.  HENT:     Head: Normocephalic and atraumatic.  Eyes:     General: No scleral icterus. Cardiovascular:     Rate and Rhythm: Normal rate.  Pulmonary:     Effort: Pulmonary effort is normal.  Abdominal:     Palpations: Abdomen is soft.  Musculoskeletal:     Cervical back: Neck supple.  Skin:    General: Skin is warm and dry.  Neurological:     Mental Status: She is alert and oriented to person, place, and time.    US PELVIC COMPLETE W TRANSVAGINAL AND TORSION R/O  Result Date: 11/01/2019 CLINICAL DATA:  Initial evaluation for heavy vaginal bleeding. EXAM: TRANSABDOMINAL AND TRANSVAGINAL ULTRASOUND OF PELVIS DOPPLER ULTRASOUND OF OVARIES TECHNIQUE: Both transabdominal and transvaginal ultrasound examinations of the pelvis were performed. Transabdominal technique was performed for global imaging of the pelvis including uterus, ovaries, adnexal regions, and pelvic cul-de-sac. It was necessary to proceed with  endovaginal exam following the transabdominal exam to visualize the uterus, endometrium, and ovaries. Color and duplex Doppler ultrasound was utilized to evaluate blood flow to the ovaries. COMPARISON:  None. FINDINGS: Uterus Measurements: 9.3 x 5.1 x 6.3 cm = volume: 156 mL. Uterus is anteverted. 7 x 5 x 7 mm intramural fibroid present at the posterior uterine fundus. Few small nabothian cysts noted about the cervix. Endometrium Thickness: 7 mm.  No focal abnormality visualized. Right ovary Measurements: 3.6 x 1.7 x 1.7 cm = volume: 5.4 mL. 1.3 x 1.5 x 1.5 cm simple cyst noted, most consistent with a normal physiologic follicular cyst/dominant follicle. No other adnexal mass. Left ovary Measurements: 3.1 x 2.7 x 3.5 cm = volume: 15.3 mL. 3.2 x 2.6 x 2.6 cm hypoechoic cystic lesion with homogeneous low-level internal echoes. No appreciable internal vascularity or solid nodularity. Pulsed Doppler evaluation of both ovaries demonstrates normal low-resistance arterial and venous waveforms. Other findings No abnormal free fluid. IMPRESSION: 1. Endometrial stripe measures 7 mm in thickness. If bleeding remains unresponsive to hormonal or medical therapy, sonohysterogram should be considered for focal lesion work-up. (Ref: Radiological Reasoning: Algorithmic Workup of Abnormal Vaginal Bleeding with Endovaginal Sonography and Sonohysterography. AJR 2008; [id number]:M35-36). 2. 7 mm intramural fibroid at the posterior uterine fundus. Otherwise normal sonographic appearance of the uterus. 3. 3.2 cm hypoechoic left ovarian cystic lesion. Findings indeterminate, with primary differential considerations including a hemorrhagic cyst versus endometrioma. Short interval follow-up ultrasound in 8-12  weeks suggested for further evaluation. 4. No evidence for ovarian torsion. Electronically Signed   By: Jeannine Boga M.D.   On: 11/01/2019 01:52        Assessment & Plan:   Problem List Items Addressed This Visit       Unprioritized   Abnormal uterine bleeding    Pt. Considering hysterectomy.  Hysterectomy discussed at length, including alternative treatment with Aygestin or IUD, endometrial ablation and will resolve with menopause and risks of Hysterectomy. Risks include but are not limited to bleeding, infection, injury to surrounding structures, including bowel, bladder and ureters, blood clots, and death.  Likelihood of success is high.  Discussed vaginal hysterectomy is most likely way to perform and how long pt. Would be out of work. Also discussed endometrial ablation as alternative. Has not had BTL, but husband has vasectomy. Minor procedure, risks of success and failure discussed. Also discussed continuing on TXA and IUD placement in office. She will consider all alternatives and discuss with her husband and return following pelvic sono, for discussion of decision.      Cyst of left ovary - Primary    Appears to be endometrioma or hemorrhagic cyst--3.2 cm, will f/u in 8 wks from prior.      Relevant Orders   US PELVIC COMPLETE WITH TRANSVAGINAL      The patient was counseled on the potential benefits and lack of known risks of COVID vaccination, during pregnancy and breastfeeding, on today's visit. The patient's questions and concerns were addressed today, including infertility, myocarditis, side effects. The patient is still unsure of her decision for vaccination.   Declines flu shot   Total time in review of prior notes, pathology, labs, history taking, review with patient, exam, note writing, discussion of options, plan for next steps, alternatives and risks of treatment: 50 minutes.  Return in about 2 months (around 01/12/2020).  Donnamae Jude 11/12/2019 1:05 PM

## 2019-11-12 NOTE — Assessment & Plan Note (Signed)
Pt. Considering hysterectomy.  Hysterectomy discussed at length, including alternative treatment with Aygestin or IUD, endometrial ablation and will resolve with menopause and risks of Hysterectomy. Risks include but are not limited to bleeding, infection, injury to surrounding structures, including bowel, bladder and ureters, blood clots, and death.  Likelihood of success is high.  Discussed vaginal hysterectomy is most likely way to perform and how long pt. Would be out of work. Also discussed endometrial ablation as alternative. Has not had BTL, but husband has vasectomy. Minor procedure, risks of success and failure discussed. Also discussed continuing on TXA and IUD placement in office. She will consider all alternatives and discuss with her husband and return following pelvic sono, for discussion of decision.

## 2019-11-13 ENCOUNTER — Other Ambulatory Visit: Payer: Self-pay

## 2019-11-13 ENCOUNTER — Ambulatory Visit (INDEPENDENT_AMBULATORY_CARE_PROVIDER_SITE_OTHER): Payer: 59 | Admitting: *Deleted

## 2019-11-13 DIAGNOSIS — R350 Frequency of micturition: Secondary | ICD-10-CM

## 2019-11-13 LAB — POCT URINALYSIS DIPSTICK
Blood, UA: NEGATIVE
Leukocytes, UA: NEGATIVE

## 2019-11-13 NOTE — Progress Notes (Signed)
Pt here to give urine to send for culture.

## 2019-11-13 NOTE — Addendum Note (Signed)
Addended by: Crosby Oyster on: 11/13/2019 08:00 AM   Modules accepted: Orders

## 2019-11-16 NOTE — Progress Notes (Signed)
Patient seen and assessed by nursing staff.  Agree with documentation and plan.  

## 2019-11-17 ENCOUNTER — Telehealth: Payer: Self-pay | Admitting: *Deleted

## 2019-11-17 ENCOUNTER — Other Ambulatory Visit: Payer: Self-pay | Admitting: *Deleted

## 2019-11-17 MED ORDER — PHENAZOPYRIDINE HCL 200 MG PO TABS: 200.0000 mg | ORAL_TABLET | Freq: Three times a day (TID) | ORAL | 1 refills | Status: DC | PRN

## 2019-11-17 MED ORDER — CIPROFLOXACIN HCL 500 MG PO TABS
500.0000 mg | ORAL_TABLET | Freq: Two times a day (BID) | ORAL | 0 refills | Status: AC
Start: 2019-11-17 — End: 2019-11-22

## 2019-11-17 NOTE — Telephone Encounter (Signed)
Pt informed of preliminary urine culture results and medications sent in but may need to change them once final report comes back tomorrow. Pt verbalizes and understands.

## 2019-11-18 ENCOUNTER — Other Ambulatory Visit: Payer: Self-pay | Admitting: *Deleted

## 2019-11-18 LAB — URINE CULTURE

## 2019-11-18 MED ORDER — PHENAZOPYRIDINE HCL 200 MG PO TABS: 200.0000 mg | ORAL_TABLET | Freq: Three times a day (TID) | ORAL | 0 refills | Status: DC | PRN

## 2019-11-18 NOTE — Progress Notes (Signed)
Pyridium was not covered on pt insurance, pharmacy requested change

## 2019-11-19 ENCOUNTER — Other Ambulatory Visit: Payer: Self-pay | Admitting: Family Medicine

## 2019-11-19 DIAGNOSIS — R350 Frequency of micturition: Secondary | ICD-10-CM

## 2019-11-19 MED ORDER — SULFAMETHOXAZOLE-TRIMETHOPRIM 800-160 MG PO TABS: 1.0000 | ORAL_TABLET | Freq: Two times a day (BID) | ORAL | 0 refills | Status: AC

## 2019-11-20 DIAGNOSIS — Z20822 Contact with and (suspected) exposure to covid-19: Secondary | ICD-10-CM | POA: Diagnosis not present

## 2019-11-20 DIAGNOSIS — Z03818 Encounter for observation for suspected exposure to other biological agents ruled out: Secondary | ICD-10-CM | POA: Diagnosis not present

## 2019-11-30 ENCOUNTER — Other Ambulatory Visit: Payer: Self-pay | Admitting: *Deleted

## 2019-11-30 MED ORDER — CIPROFLOXACIN HCL 500 MG PO TABS
500.0000 mg | ORAL_TABLET | Freq: Two times a day (BID) | ORAL | 0 refills | Status: DC
Start: 2019-11-30 — End: 2021-01-17

## 2019-12-01 ENCOUNTER — Other Ambulatory Visit: Payer: Self-pay

## 2019-12-01 ENCOUNTER — Ambulatory Visit (INDEPENDENT_AMBULATORY_CARE_PROVIDER_SITE_OTHER): Payer: 59 | Admitting: Dermatology

## 2019-12-01 DIAGNOSIS — D2271 Melanocytic nevi of right lower limb, including hip: Secondary | ICD-10-CM

## 2019-12-01 DIAGNOSIS — D2261 Melanocytic nevi of right upper limb, including shoulder: Secondary | ICD-10-CM

## 2019-12-01 DIAGNOSIS — D225 Melanocytic nevi of trunk: Secondary | ICD-10-CM | POA: Diagnosis not present

## 2019-12-01 DIAGNOSIS — D229 Melanocytic nevi, unspecified: Secondary | ICD-10-CM

## 2019-12-01 DIAGNOSIS — Q828 Other specified congenital malformations of skin: Secondary | ICD-10-CM

## 2019-12-01 DIAGNOSIS — L0100 Impetigo, unspecified: Secondary | ICD-10-CM | POA: Diagnosis not present

## 2019-12-01 DIAGNOSIS — D2371 Other benign neoplasm of skin of right lower limb, including hip: Secondary | ICD-10-CM

## 2019-12-01 DIAGNOSIS — D239 Other benign neoplasm of skin, unspecified: Secondary | ICD-10-CM

## 2019-12-01 DIAGNOSIS — D485 Neoplasm of uncertain behavior of skin: Secondary | ICD-10-CM | POA: Diagnosis not present

## 2019-12-01 DIAGNOSIS — L570 Actinic keratosis: Secondary | ICD-10-CM

## 2019-12-01 DIAGNOSIS — Z86018 Personal history of other benign neoplasm: Secondary | ICD-10-CM

## 2019-12-01 DIAGNOSIS — L578 Other skin changes due to chronic exposure to nonionizing radiation: Secondary | ICD-10-CM

## 2019-12-01 DIAGNOSIS — Z1283 Encounter for screening for malignant neoplasm of skin: Secondary | ICD-10-CM | POA: Diagnosis not present

## 2019-12-01 DIAGNOSIS — L814 Other melanin hyperpigmentation: Secondary | ICD-10-CM

## 2019-12-01 DIAGNOSIS — L821 Other seborrheic keratosis: Secondary | ICD-10-CM

## 2019-12-01 HISTORY — DX: Actinic keratosis: L57.0

## 2019-12-01 MED ORDER — MUPIROCIN 2 % EX OINT: 1.0000 "application " | TOPICAL_OINTMENT | Freq: Two times a day (BID) | CUTANEOUS | 0 refills | Status: DC

## 2019-12-01 NOTE — Patient Instructions (Addendum)
Melanoma ABCDEs  Melanoma is the most dangerous type of skin cancer, and is the leading cause of death from skin disease.  You are more likely to develop melanoma if you:  Have light-colored skin, light-colored eyes, or red or blond hair  Spend a lot of time in the sun  Tan regularly, either outdoors or in a tanning bed  Have had blistering sunburns, especially during childhood  Have a close family member who has had a melanoma  Have atypical moles or large birthmarks  Early detection of melanoma is key since treatment is typically straightforward and cure rates are extremely high if we catch it early.   The first sign of melanoma is often a change in a mole or a new dark spot.  The ABCDE system is a way of remembering the signs of melanoma.  A for asymmetry:  The two halves do not match. B for border:  The edges of the growth are irregular. C for color:  A mixture of colors are present instead of an even brown color. D for diameter:  Melanomas are usually (but not always) greater than 6mm - the size of a pencil eraser. E for evolution:  The spot keeps changing in size, shape, and color.  Please check your skin once per month between visits. You can use a small mirror in front and a large mirror behind you to keep an eye on the back side or your body.   If you see any new or changing lesions before your next follow-up, please call to schedule a visit.  Please continue daily skin protection including broad spectrum sunscreen SPF 30+ to sun-exposed areas, reapplying every 2 hours as needed when you're outdoors.   Cryotherapy Aftercare  . Wash gently with soap and water everyday.   . Apply Vaseline and Band-Aid daily until healed.  Wound Care Instructions  1. Cleanse wound gently with soap and water once a day then pat dry with clean gauze. Apply a thing coat of Petrolatum (petroleum jelly, "Vaseline") over the wound (unless you have an allergy to this). We recommend that you use  a new, sterile tube of Vaseline. Do not pick or remove scabs. Do not remove the yellow or white "healing tissue" from the base of the wound.  2. Cover the wound with fresh, clean, nonstick gauze and secure with paper tape. You may use Band-Aids in place of gauze and tape if the would is small enough, but would recommend trimming much of the tape off as there is often too much. Sometimes Band-Aids can irritate the skin.  3. You should call the office for your biopsy report after 1 week if you have not already been contacted.  4. If you experience any problems, such as abnormal amounts of bleeding, swelling, significant bruising, significant pain, or evidence of infection, please call the office immediately.  5. FOR ADULT SURGERY PATIENTS: If you need something for pain relief you may take 1 extra strength Tylenol (acetaminophen) AND 2 Ibuprofen (200mg each) together every 4 hours as needed for pain. (do not take these if you are allergic to them or if you have a reason you should not take them.) Typically, you may only need pain medication for 1 to 3 days.     

## 2019-12-01 NOTE — Progress Notes (Signed)
Follow-Up Visit   Subjective  Tonya Leonard is a 44 y.o. female who presents for the following: Annual Exam.  Patient here for full body skin exam and skin cancer screening. No history of skin cancer but she has had a dysplastic nevus that was treated by Dr. Phillip Heal. She does have a spot at nose that has been there for about 1 year that comes and goes. It gets scaly and she picks it off.  Also a scab comes and goes inside the edge on the Left nostril.  She has also noticed a tender spot on her L shoulder.  She has a lot of moles, but doesn't think any have changed.  A couple moles are annoying and catch on clothes.   The following portions of the chart were reviewed this encounter and updated as appropriate:      Review of Systems:  No other skin or systemic complaints except as noted in HPI or Assessment and Plan.  Objective  Well appearing patient in no apparent distress; mood and affect are within normal limits.  A full examination was performed including scalp, head, eyes, ears, nose, lips, neck, chest, axillae, abdomen, back, buttocks, bilateral upper extremities, bilateral lower extremities, hands, feet, fingers, toes, fingernails, and toenails. All findings within normal limits unless otherwise noted below.  Objective  Right Nasal Tip x 1: Indistinct pink scaly macule   Objective  Right Mid Back: 19mm fleshy speckled papule     Objective  Right Popliteal: 3.46mm speckled brown papule     Objective  Left Medial Post Shoulder: 6 x 27mm light pink flesh pearly papule     Objective  Right  Posterior arm above elbow: 48mm medium dark brown macule with adjacent 2.63mm med dark brown macule  Right upper pretibia: 61mm medium brown macule with notch  Right Thigh - Posterior: 37mm dark brown macule  Images        Objective  Left Medial Lower Leg: Waxy flesh macule with keratotic rim 28mm  Objective  Right lower hip, right calf: Firm pink/brown papulenodule  with dimple sign.   Objective  Left med nostril: Mild erythema with small fissure   Assessment & Plan  AK (actinic keratosis) Right Nasal Tip x 1  Recheck on f/up, pt will RTC if not clear  Destruction of lesion - Right Nasal Tip x 1  Destruction method: cryotherapy   Informed consent: discussed and consent obtained   Lesion destroyed using liquid nitrogen: Yes   Region frozen until ice ball extended beyond lesion: Yes   Outcome: patient tolerated procedure well with no complications   Post-procedure details: wound care instructions given    Neoplasm of uncertain behavior of skin (3) Right Mid Back  Epidermal / dermal shaving  Lesion diameter (cm):  0.4 Informed consent: discussed and consent obtained   Patient was prepped and draped in usual sterile fashion: Area prepped with alcohol. Anesthesia: the lesion was anesthetized in a standard fashion   Anesthetic:  1% lidocaine w/ epinephrine 1-100,000 buffered w/ 8.4% NaHCO3 Instrument used: flexible razor blade   Hemostasis achieved with: pressure, aluminum chloride and electrodesiccation   Outcome: patient tolerated procedure well   Post-procedure details: wound care instructions given   Post-procedure details comment:  Ointment and small bandage applied.   Specimen 1 - Surgical pathology Differential Diagnosis: Irritated Nevus vs Other Check Margins: No 69mm fleshy speckled papule  Right Popliteal  Epidermal / dermal shaving  Lesion diameter (cm):  0.4 Informed consent: discussed and  consent obtained   Patient was prepped and draped in usual sterile fashion: Area prepped with alcohol. Anesthesia: the lesion was anesthetized in a standard fashion   Anesthetic:  1% lidocaine w/ epinephrine 1-100,000 buffered w/ 8.4% NaHCO3 Instrument used: flexible razor blade   Hemostasis achieved with: pressure, aluminum chloride and electrodesiccation   Outcome: patient tolerated procedure well   Post-procedure details: wound  care instructions given   Post-procedure details comment:  Ointment and small bandage applied.   Specimen 2 - Surgical pathology Differential Diagnosis: Irritated Nevus vs Other Check Margins: No 3.4mm speckled brown papule  Left Medial Post Shoulder  Skin / nail biopsy Type of biopsy: tangential   Informed consent: discussed and consent obtained   Patient was prepped and draped in usual sterile fashion: Area prepped with alcohol. Anesthesia: the lesion was anesthetized in a standard fashion   Anesthetic:  1% lidocaine w/ epinephrine 1-100,000 buffered w/ 8.4% NaHCO3 Instrument used: flexible razor blade   Hemostasis achieved with: pressure, aluminum chloride and electrodesiccation   Outcome: patient tolerated procedure well   Post-procedure details: wound care instructions given   Post-procedure details comment:  Ointment and small bandage applied  Specimen 3 - Surgical pathology Differential Diagnosis: AK vs BCC Check Margins: No 6 x 9mm light pink flesh pearly papule  Nevus (3) Right  Posterior arm above elbow; Right Thigh - Posterior; Right upper pretibia  Benign-appearing.  Observation.  Call clinic for new or changing lesions.  Recommend daily use of broad spectrum spf 30+ sunscreen to sun-exposed areas.    Porokeratosis Left Medial Lower Leg  Benign, observe.    Dermatofibroma Right lower hip, right calf  Benign, observe.     Impetigo Left med nostril  Probable staph  Start mupirocin inside nose twice daily for 1 week #22 0RF  Ordered Medications: mupirocin ointment (BACTROBAN) 2 %   Lentigines - Scattered tan macules at face, arms - Discussed due to sun exposure - Benign, observe - Call for any changes  Seborrheic Keratoses - Stuck-on, waxy, tan-brown papules and plaques  - Discussed benign etiology and prognosis. - Observe - Call for any changes  Melanocytic Nevi - Tan-brown and/or pink-flesh-colored symmetric macules and papules at arms,  back, legs - Benign appearing on exam today - Observation - Call clinic for new or changing moles - Recommend daily use of broad spectrum spf 30+ sunscreen to sun-exposed areas.   Actinic Damage - diffuse mild erythema with underlying dyspigmentation - Recommend daily broad spectrum sunscreen SPF 30+ to sun-exposed areas, reapply every 2 hours as needed.  - Call for new or changing lesions.  Skin cancer screening performed today.  History of Dysplastic Nevi - No evidence of recurrence today, scar at left lateral ankle - Recommend regular full body skin exams - Recommend daily broad spectrum sunscreen SPF 30+ to sun-exposed areas, reapply every 2 hours as needed.  - Call if any new or changing lesions are noted between office visits  Return in about 1 year (around 11/30/2020) for TBSE.  Graciella Belton, RMA, am acting as scribe for Brendolyn Patty, MD . Documentation: I have reviewed the above documentation for accuracy and completeness, and I agree with the above.  Brendolyn Patty MD

## 2019-12-02 ENCOUNTER — Encounter: Payer: Self-pay | Admitting: Radiology

## 2019-12-02 DIAGNOSIS — M79672 Pain in left foot: Secondary | ICD-10-CM | POA: Diagnosis not present

## 2019-12-07 ENCOUNTER — Telehealth: Payer: Self-pay

## 2019-12-07 NOTE — Telephone Encounter (Signed)
-----   Message from Brendolyn Patty, MD sent at 12/07/2019  9:52 AM EDT ----- 1. Skin , right mid back MELANOCYTIC NEVUS, INTRADERMAL TYPE 2. Skin , right popliteal MELANOCYTIC NEVUS, INTRADERMAL TYPE 3. Skin , left medial post shoulder LICHENOID ACTINIC KERATOSIS  1,2. Benign moles 3. Precancer, needs LN2 on follow-up

## 2019-12-07 NOTE — Telephone Encounter (Signed)
Advised pt of bx results.  Scheduled pt for 2 month f/u to recheck the Lichenoid actinic keratosis and possibly treat with LN2./sh

## 2019-12-16 ENCOUNTER — Other Ambulatory Visit: Payer: Self-pay

## 2019-12-16 ENCOUNTER — Ambulatory Visit
Admission: RE | Admit: 2019-12-16 | Discharge: 2019-12-16 | Disposition: A | Discharge status: Home / Self Care | Payer: 59 | Source: Ambulatory Visit | Attending: Family Medicine | Admitting: Family Medicine

## 2019-12-16 DIAGNOSIS — N83292 Other ovarian cyst, left side: Secondary | ICD-10-CM | POA: Diagnosis not present

## 2019-12-16 DIAGNOSIS — N83202 Unspecified ovarian cyst, left side: Secondary | ICD-10-CM | POA: Diagnosis not present

## 2019-12-16 DIAGNOSIS — N838 Other noninflammatory disorders of ovary, fallopian tube and broad ligament: Secondary | ICD-10-CM | POA: Diagnosis not present

## 2019-12-16 DIAGNOSIS — D259 Leiomyoma of uterus, unspecified: Secondary | ICD-10-CM | POA: Diagnosis not present

## 2019-12-16 DIAGNOSIS — N809 Endometriosis, unspecified: Secondary | ICD-10-CM | POA: Diagnosis not present

## 2020-01-14 ENCOUNTER — Ambulatory Visit (INDEPENDENT_AMBULATORY_CARE_PROVIDER_SITE_OTHER): Payer: 59 | Admitting: *Deleted

## 2020-01-14 ENCOUNTER — Other Ambulatory Visit: Payer: Self-pay

## 2020-01-14 ENCOUNTER — Other Ambulatory Visit (HOSPITAL_COMMUNITY)
Admission: RE | Admit: 2020-01-14 | Discharge: 2020-01-14 | Disposition: A | Discharge status: Home / Self Care | Payer: 59 | Source: Ambulatory Visit | Attending: Obstetrics and Gynecology | Admitting: Obstetrics and Gynecology

## 2020-01-14 DIAGNOSIS — N898 Other specified noninflammatory disorders of vagina: Secondary | ICD-10-CM | POA: Insufficient documentation

## 2020-01-14 DIAGNOSIS — R3915 Urgency of urination: Secondary | ICD-10-CM | POA: Diagnosis not present

## 2020-01-14 LAB — POCT URINALYSIS DIPSTICK
Blood, UA: NEGATIVE
Leukocytes, UA: NEGATIVE

## 2020-01-14 MED ORDER — FOSFOMYCIN TROMETHAMINE 3 G PO PACK: 3.0000 g | PACK | Freq: Once | ORAL | 1 refills | Status: AC

## 2020-01-14 NOTE — Progress Notes (Signed)
SUBJECTIVE: Tonya Leonard is a 44 y.o. female who complains of urinary urgency without flank pain, fever, chills, or abnormal vaginal discharge or bleeding. Pt unsure if previous cleared all the way up. Pt also having some vaginal discharge.   OBJECTIVE: Appears well, in no apparent distress.  Vital signs are normal. Urine dipstick shows negative for all components.    ASSESSMENT: Urinary urgency, vaginal discharge  PLAN: Urine culture, swab for BVAG and CVAG sent to lab. Treatment per Dr Kennon Rounds.  Call or return to clinic prn if these symptoms worsen or fail to improve as anticipated.

## 2020-01-15 NOTE — Progress Notes (Signed)
Patient seen and assessed by nursing staff.  Agree with documentation and plan.  

## 2020-01-17 LAB — URINE CULTURE

## 2020-01-18 LAB — CERVICOVAGINAL ANCILLARY ONLY
Bacterial Vaginitis (gardnerella): NEGATIVE
Candida Glabrata: NEGATIVE
Candida Vaginitis: NEGATIVE
Comment: NEGATIVE
Comment: NEGATIVE
Comment: NEGATIVE

## 2020-02-08 ENCOUNTER — Ambulatory Visit: Payer: 59 | Admitting: Dermatology

## 2020-02-17 ENCOUNTER — Ambulatory Visit: Payer: 59 | Admitting: Family Medicine

## 2020-03-08 DIAGNOSIS — Z1231 Encounter for screening mammogram for malignant neoplasm of breast: Secondary | ICD-10-CM | POA: Diagnosis not present

## 2020-03-09 DIAGNOSIS — M9903 Segmental and somatic dysfunction of lumbar region: Secondary | ICD-10-CM | POA: Diagnosis not present

## 2020-03-09 DIAGNOSIS — M542 Cervicalgia: Secondary | ICD-10-CM | POA: Diagnosis not present

## 2020-03-09 DIAGNOSIS — M9901 Segmental and somatic dysfunction of cervical region: Secondary | ICD-10-CM | POA: Diagnosis not present

## 2020-03-09 DIAGNOSIS — M62830 Muscle spasm of back: Secondary | ICD-10-CM | POA: Diagnosis not present

## 2020-04-05 ENCOUNTER — Other Ambulatory Visit: Payer: Self-pay | Admitting: *Deleted

## 2020-04-05 DIAGNOSIS — Z1211 Encounter for screening for malignant neoplasm of colon: Secondary | ICD-10-CM

## 2020-04-12 DIAGNOSIS — N6002 Solitary cyst of left breast: Secondary | ICD-10-CM | POA: Diagnosis not present

## 2020-04-12 DIAGNOSIS — Z1211 Encounter for screening for malignant neoplasm of colon: Secondary | ICD-10-CM | POA: Diagnosis not present

## 2020-04-13 ENCOUNTER — Other Ambulatory Visit: Payer: Self-pay | Admitting: General Surgery

## 2020-04-13 DIAGNOSIS — M9903 Segmental and somatic dysfunction of lumbar region: Secondary | ICD-10-CM | POA: Diagnosis not present

## 2020-04-13 DIAGNOSIS — M62830 Muscle spasm of back: Secondary | ICD-10-CM | POA: Diagnosis not present

## 2020-04-13 DIAGNOSIS — M9901 Segmental and somatic dysfunction of cervical region: Secondary | ICD-10-CM | POA: Diagnosis not present

## 2020-04-13 DIAGNOSIS — M542 Cervicalgia: Secondary | ICD-10-CM | POA: Diagnosis not present

## 2020-04-13 NOTE — Progress Notes (Signed)
Subjective:     Patient ID: Tonya Leonard is a 45 y.o. female.  HPI  The following portions of the patient's history were reviewed and updated as appropriate.  This an established patient is here today for: office visit. Here for evaluation of left breast mass. She states she noticed this left breast mass/cyst in November after she had COVID.  She had significant disruption in her menstrual cycles after her Covid infection.  This included 21 days of menorrhagia requiring medication for cessation.  She did have a past history of intermittent heavy periods which required occasional progesterone administration to stop.   She had her mammogram in December at [facility].Marland Kitchen   She states the area is much smaller, there is a "puckers out" area. The patient was seen in 2015 for breast cysts.  The patient has a 71 year old son at [facility], a boy and girl set of twins, age 36 who have just started high school and she had a stillbirth in the distant past.  Unremarkable breast-feeding history after the twins except for marked diminution in breast volume was reported.   Review of Systems  Constitutional: Negative for chills and fever.  Respiratory: Negative for cough.        Objective:   Physical Exam Exam conducted with a chaperone present.  Constitutional:      Appearance: Normal appearance.  Cardiovascular:     Rate and Rhythm: Normal rate and regular rhythm.     Pulses: Normal pulses.     Heart sounds: Normal heart sounds.  Pulmonary:     Effort: Pulmonary effort is normal.     Breath sounds: Normal breath sounds.  Chest:  Breasts:     Right: Normal. No axillary adenopathy or supraclavicular adenopathy.     Left: Mass present. No axillary adenopathy or supraclavicular adenopathy.     Musculoskeletal:     Cervical back: Neck supple.  Lymphadenopathy:     Upper Body:     Right upper body: No supraclavicular or axillary adenopathy.     Left upper body: No  supraclavicular or axillary adenopathy.  Skin:    General: Skin is warm and dry.  Neurological:     Mental Status: She is alert and oriented to person, place, and time.  Psychiatric:        Mood and Affect: Mood normal.        Behavior: Behavior normal.   Laboratory/imaging/record review:  07/09/2013 office summary:  Because of the family history, her mother reportedly underwent genetic testing and was found not to be a BRCA carrier.  The patient's mother, [name], had brought an extended family tree to the office in 2012: This documented multiple generations of breast, ovarian, colon and esophageal cancer. Ms. Mones mother had tested BRCA negative. Her mother is family history dating back to the maternal great-grandfather is positive only for breast cancer.  The patient's aunts: [name] had breast cancer but was BRCA negative, [name] was BRCA positive. Her maternal grandfather had bilateral breast cancer.  Mammogram review:  03/08/2020 bilateral screening mammograms completed at [facility] were independently reviewed.  Large cysts involving especially the upper outer quadrant of the left breast.  No interval change.  Ultrasound exam:  While the patient had reported a decrease in size over the last several weeks, it was elected to assess the areas due to the overlying skin distortion.  In the left breast at the 4 o'clock position 3 cm from the nipple the cyst comes to within 3 mm  of the overlying skin.  The estimated diameter is 3.8 x 4.86 x 5.2 cm.  There are other adjacent cysts superior to this in the three and 2 o'clock position.  The patient was amenable to aspiration.  Making use of alcohol and 1 cc of 1% plain Xylocaine 127 cc of pale green fluid was aspirated without incident.  This was followed by the injection of 10 cc of air to help desiccate the cyst lining.  The procedure was well-tolerated.  The skin color changes noted prior to cyst aspiration had resolved with  removal of the cyst fluid.      Assessment:     Breast cyst development after recent hormonal flux post Covid infection.  Candidate for colon screening.      Plan:     As noted above, the patient has no family history of colon cancer.  Options for colon screening were reviewed including one) Cologuard and two) colonoscopy.  We reviewed the pros and cons of each.  It was decided to proceed to colonoscopy.  The patient will be reassessed in 1 month, and was advised that she may develop recurrent cyst fluid accumulation.  We will attempt to locate a copy of the detail genetic history the patient's mother had previously provided for review.      Entered by Karie Fetch, RN, acting as a scribe for Dr. Hervey Ard, MD.  The documentation recorded by the scribe accurately reflects the service I personally performed and the decisions made by me.   Robert Bellow, MD FACS

## 2020-04-25 ENCOUNTER — Other Ambulatory Visit: Payer: Self-pay

## 2020-04-25 ENCOUNTER — Other Ambulatory Visit
Admission: RE | Admit: 2020-04-25 | Discharge: 2020-04-25 | Disposition: A | Discharge status: Home / Self Care | Payer: 59 | Source: Ambulatory Visit | Attending: General Surgery | Admitting: General Surgery

## 2020-04-25 DIAGNOSIS — Z20822 Contact with and (suspected) exposure to covid-19: Secondary | ICD-10-CM | POA: Diagnosis not present

## 2020-04-25 DIAGNOSIS — Z01812 Encounter for preprocedural laboratory examination: Secondary | ICD-10-CM | POA: Diagnosis not present

## 2020-04-26 ENCOUNTER — Encounter: Payer: Self-pay | Admitting: General Surgery

## 2020-04-26 LAB — SARS CORONAVIRUS 2 (TAT 6-24 HRS): SARS Coronavirus 2: NEGATIVE

## 2020-04-27 ENCOUNTER — Other Ambulatory Visit: Payer: Self-pay

## 2020-04-27 ENCOUNTER — Encounter: Payer: Self-pay | Admitting: General Surgery

## 2020-04-27 ENCOUNTER — Encounter
Admission: RE | Disposition: A | Discharge status: Home / Self Care | Payer: Self-pay | Source: Home / Self Care | Attending: General Surgery

## 2020-04-27 ENCOUNTER — Ambulatory Visit: Payer: 59 | Admitting: Registered Nurse

## 2020-04-27 ENCOUNTER — Ambulatory Visit
Admission: RE | Admit: 2020-04-27 | Discharge: 2020-04-27 | Disposition: A | Discharge status: Home / Self Care | Payer: 59 | Attending: General Surgery | Admitting: General Surgery

## 2020-04-27 DIAGNOSIS — Z91040 Latex allergy status: Secondary | ICD-10-CM | POA: Diagnosis not present

## 2020-04-27 DIAGNOSIS — D122 Benign neoplasm of ascending colon: Secondary | ICD-10-CM | POA: Insufficient documentation

## 2020-04-27 DIAGNOSIS — Z7952 Long term (current) use of systemic steroids: Secondary | ICD-10-CM | POA: Insufficient documentation

## 2020-04-27 DIAGNOSIS — K635 Polyp of colon: Secondary | ICD-10-CM | POA: Diagnosis not present

## 2020-04-27 DIAGNOSIS — Z1211 Encounter for screening for malignant neoplasm of colon: Secondary | ICD-10-CM | POA: Diagnosis not present

## 2020-04-27 DIAGNOSIS — Z79899 Other long term (current) drug therapy: Secondary | ICD-10-CM | POA: Insufficient documentation

## 2020-04-27 HISTORY — PX: COLONOSCOPY WITH PROPOFOL: SHX5780

## 2020-04-27 LAB — POCT PREGNANCY, URINE: Preg Test, Ur: NEGATIVE

## 2020-04-27 SURGERY — COLONOSCOPY WITH PROPOFOL
Anesthesia: General

## 2020-04-27 MED ORDER — PROPOFOL 10 MG/ML IV BOLUS
INTRAVENOUS | Status: DC | PRN
  Administered 2020-04-27: 20 mg via INTRAVENOUS
  Administered 2020-04-27: 70 mg via INTRAVENOUS
  Administered 2020-04-27: 10 mg via INTRAVENOUS

## 2020-04-27 MED ORDER — PROPOFOL 500 MG/50ML IV EMUL
INTRAVENOUS | Status: DC | PRN
  Administered 2020-04-27: 150 ug/kg/min via INTRAVENOUS

## 2020-04-27 MED ORDER — LIDOCAINE HCL (CARDIAC) PF 100 MG/5ML IV SOSY
PREFILLED_SYRINGE | INTRAVENOUS | Status: DC | PRN
  Administered 2020-04-27: 100 mg via INTRAVENOUS

## 2020-04-27 MED ORDER — SODIUM CHLORIDE 0.9 % IV SOLN: INTRAVENOUS | Status: DC

## 2020-04-27 NOTE — Transfer of Care (Signed)
Immediate Anesthesia Transfer of Care Note  Patient: Tonya Leonard  Procedure(s) Performed: COLONOSCOPY WITH PROPOFOL (N/A )  Patient Location: Endoscopy Unit  Anesthesia Type:General  Level of Consciousness: drowsy  Airway & Oxygen Therapy: Patient Spontanous Breathing  Post-op Assessment: Report given to RN and Post -op Vital signs reviewed and stable  Post vital signs: Reviewed and stable  Last Vitals:  Vitals Value Taken Time  BP 99/57   Temp    Pulse 80 04/27/20 0944  Resp 17 04/27/20 0944  SpO2 100 % 04/27/20 0944  Vitals shown include unvalidated device data.  Last Pain:  Vitals:   04/27/20 0857  TempSrc: Temporal  PainSc: 6       Patients Stated Pain Goal: 0 (49/70/26 3785)  Complications: No complications documented.

## 2020-04-27 NOTE — Anesthesia Preprocedure Evaluation (Signed)
Anesthesia Evaluation  Patient identified by MRN, date of birth, ID band Patient awake    Reviewed: Allergy & Precautions, H&P , NPO status , Patient's Chart, lab work & pertinent test results  Airway Mallampati: II  TM Distance: >3 FB Neck ROM: Full    Dental no notable dental hx.    Pulmonary shortness of breath and with exertion,    Pulmonary exam normal        Cardiovascular Exercise Tolerance: Good negative cardio ROS Normal cardiovascular exam     Neuro/Psych negative neurological ROS  negative psych ROS   GI/Hepatic negative GI ROS, Neg liver ROS, Bowel prep,  Endo/Other  negative endocrine ROS  Renal/GU negative Renal ROS  negative genitourinary   Musculoskeletal negative musculoskeletal ROS (+)   Abdominal   Peds negative pediatric ROS (+)  Hematology negative hematology ROS (+)   Anesthesia Other Findings Covid Aug 2021, Occ SOB  Reproductive/Obstetrics negative OB ROS                             Anesthesia Physical Anesthesia Plan  ASA: II  Anesthesia Plan: General   Post-op Pain Management:    Induction: Intravenous  PONV Risk Score and Plan: 2 and Propofol infusion and TIVA  Airway Management Planned: Natural Airway and Nasal Cannula  Additional Equipment:   Intra-op Plan:   Post-operative Plan:   Informed Consent: I have reviewed the patients History and Physical, chart, labs and discussed the procedure including the risks, benefits and alternatives for the proposed anesthesia with the patient or authorized representative who has indicated his/her understanding and acceptance.       Plan Discussed with: CRNA, Anesthesiologist and Surgeon  Anesthesia Plan Comments:         Anesthesia Quick Evaluation

## 2020-04-27 NOTE — H&P (Signed)
Tonya Leonard [id number] 08-13-1975     HPI: Healthy 45 y/o woman for a screening colonoscopy.  Tolerated prep well.   Medications Prior to Admission  Medication Sig Dispense Refill Last Dose  . cholecalciferol (VITAMIN D) 1000 units tablet Take 1 tablet (1,000 Units total) by mouth daily. 30 tablet 11 Past Week at Unknown time  . ferrous sulfate 325 (65 FE) MG tablet Take 325 mg by mouth daily with breakfast.   Past Week at Unknown time  . ciprofloxacin (CIPRO) 500 MG tablet Take 1 tablet (500 mg total) by mouth 2 (two) times daily. 14 tablet 0   . mupirocin ointment (BACTROBAN) 2 % Place 1 application into the nose 2 (two) times daily. 22 g 0   . phenazopyridine (PHENAZO) 200 MG tablet Take 1 tablet (200 mg total) by mouth 3 (three) times daily as needed for pain. 10 tablet 0   . phenazopyridine (PYRIDIUM) 200 MG tablet Take 1 tablet (200 mg total) by mouth 3 (three) times daily as needed for pain (urethral spasm). (Patient not taking: Reported on 04/27/2020) 10 tablet 1 Not Taking at Unknown time  . predniSONE (DELTASONE) 50 MG tablet Take 1 tablet by mouth daily (Patient not taking: Reported on 11/12/2019) 5 tablet 0   . tranexamic acid (LYSTEDA) 650 MG TABS tablet Take 1 tablet (650 mg total) by mouth 3 (three) times daily. (Patient not taking: Reported on 04/27/2020) 60 tablet 3 Not Taking at Unknown time  . valACYclovir (VALTREX) 1000 MG tablet Take 0.5 tablets (500 mg total) by mouth daily. For prevention, increase to 1 tab twice daily for outbreak (Patient not taking: Reported on 04/27/2020) 90 tablet 2 Not Taking at Unknown time   Allergies  Allergen Reactions  . Latex Rash    Other reaction(s): Unknown   Past Medical History:  Diagnosis Date  . Breast mass 2013   right breast  . Fibrocystic breast changes of both breasts    Past Surgical History:  Procedure Laterality Date  . BREAST BIOPSY Right 12/2011  . BREAST CYST ASPIRATION Left    Social History   Socioeconomic  History  . Marital status: Married    Spouse name: Not on file  . Number of children: Not on file  . Years of education: Not on file  . Highest education level: Not on file  Occupational History  . Not on file  Tobacco Use  . Smoking status: Never Smoker  . Smokeless tobacco: Never Used  Vaping Use  . Vaping Use: Never used  Substance and Sexual Activity  . Alcohol use: Yes    Comment: occas  . Drug use: No  . Sexual activity: Yes    Birth control/protection: Surgical    Comment: vasectomy  Other Topics Concern  . Not on file  Social History Narrative  . Not on file   Social Determinants of Health   Financial Resource Strain: Not on file  Food Insecurity: Not on file  Transportation Needs: Not on file  Physical Activity: Not on file  Stress: Not on file  Social Connections: Not on file  Intimate Partner Violence: Not on file   Social History   Social History Narrative  . Not on file     ROS: Negative.     PE: HEENT: Negative. Lungs: Clear. Cardio: RR.  Assessment/Plan:  Proceed with planned endoscopy.  [facility] 04/27/2020

## 2020-04-27 NOTE — Anesthesia Postprocedure Evaluation (Signed)
Anesthesia Post Note  Patient: Tonya Leonard  Procedure(s) Performed: COLONOSCOPY WITH PROPOFOL (N/A )  Patient location during evaluation: Phase II Anesthesia Type: General Level of consciousness: awake and alert, awake and oriented Pain management: pain level controlled Vital Signs Assessment: post-procedure vital signs reviewed and stable Respiratory status: spontaneous breathing, nonlabored ventilation and respiratory function stable Cardiovascular status: blood pressure returned to baseline and stable Postop Assessment: no apparent nausea or vomiting Anesthetic complications: no   No complications documented.   Last Vitals:  Vitals:   04/27/20 1004 04/27/20 1014  BP: 117/73 116/80  Pulse: 76 74  Resp: 17 11  Temp:    SpO2: 100% 100%    Last Pain:  Vitals:   04/27/20 1014  TempSrc:   PainSc: 0-No pain                 Phill Mutter

## 2020-04-27 NOTE — Op Note (Signed)
[facility] Gastroenterology Patient Name: Tonya Leonard Procedure Date: 04/27/2020 8:59 AM MRN: [id number] Account #: [id number] Date of Birth: 1975-09-13 Admit Type: Outpatient Age: 45 Room: [facility] ENDO ROOM 1 Gender: Female Note Status: Finalized Procedure:             Colonoscopy Indications:           Screening for colorectal malignant neoplasm Providers:             Robert Bellow, MD Referring MD:          No Local Md, MD (Referring MD) Medicines:             Monitored Anesthesia Care Complications:         No immediate complications. Procedure:             Pre-Anesthesia Assessment:                        - Prior to the procedure, a History and Physical was                         performed, and patient medications, allergies and                         sensitivities were reviewed. The patient's tolerance                         of previous anesthesia was reviewed.                        - The risks and benefits of the procedure and the                         sedation options and risks were discussed with the                         patient. All questions were answered and informed                         consent was obtained.                        After obtaining informed consent, the colonoscope was                         passed under direct vision. Throughout the procedure,                         the patient's blood pressure, pulse, and oxygen                         saturations were monitored continuously. The                         Colonoscope was introduced through the anus and                         advanced to the the cecum, identified by appendiceal                         orifice and  ileocecal valve. The colonoscopy was                         performed without difficulty. The patient tolerated                         the procedure well. The quality of the bowel                         preparation was excellent. Findings:      A 10 mm  polyp was found in the mid ascending colon. The polyp was       semi-pedunculated. The polyp was removed with a hot snare. Resection and       retrieval were complete.      The retroflexed view of the distal rectum and anal verge was normal and       showed no anal or rectal abnormalities. Impression:            - One 10 mm polyp in the mid ascending colon, removed                         with a hot snare. Resected and retrieved.                        - The distal rectum and anal verge are normal on                         retroflexion view. Recommendation:        - Telephone endoscopist for pathology results in 1                         week. Procedure Code(s):     --- Professional ---                        [phone], Colonoscopy, flexible; with removal of                         tumor(s), polyp(s), or other lesion(s) by snare                         technique Diagnosis Code(s):     --- Professional ---                        Z12.11, Encounter for screening for malignant neoplasm                         of colon                        K63.5, Polyp of colon CPT copyright 2019 American [employer] Association. All rights reserved. The codes documented in this report are preliminary and upon coder review may  be revised to meet current compliance requirements. Robert Bellow, MD 04/27/2020 9:45:13 AM This report has been signed electronically. Number of Addenda: 0 Note Initiated On: 04/27/2020 8:59 AM Scope Withdrawal Time: 0 hours 10 minutes 37 seconds  Total Procedure Duration: 0 hours 14 minutes 16 seconds  Estimated Blood Loss:  Estimated blood loss: none.      [facility]

## 2020-04-28 ENCOUNTER — Encounter: Payer: Self-pay | Admitting: General Surgery

## 2020-04-28 LAB — SURGICAL PATHOLOGY

## 2020-05-09 DIAGNOSIS — M62830 Muscle spasm of back: Secondary | ICD-10-CM | POA: Diagnosis not present

## 2020-05-09 DIAGNOSIS — M542 Cervicalgia: Secondary | ICD-10-CM | POA: Diagnosis not present

## 2020-05-09 DIAGNOSIS — M9903 Segmental and somatic dysfunction of lumbar region: Secondary | ICD-10-CM | POA: Diagnosis not present

## 2020-05-09 DIAGNOSIS — M9901 Segmental and somatic dysfunction of cervical region: Secondary | ICD-10-CM | POA: Diagnosis not present

## 2020-06-27 DIAGNOSIS — M542 Cervicalgia: Secondary | ICD-10-CM | POA: Diagnosis not present

## 2020-06-27 DIAGNOSIS — M9903 Segmental and somatic dysfunction of lumbar region: Secondary | ICD-10-CM | POA: Diagnosis not present

## 2020-06-27 DIAGNOSIS — M62830 Muscle spasm of back: Secondary | ICD-10-CM | POA: Diagnosis not present

## 2020-06-27 DIAGNOSIS — M9901 Segmental and somatic dysfunction of cervical region: Secondary | ICD-10-CM | POA: Diagnosis not present

## 2020-07-21 DIAGNOSIS — Z20822 Contact with and (suspected) exposure to covid-19: Secondary | ICD-10-CM | POA: Diagnosis not present

## 2020-07-21 DIAGNOSIS — Z03818 Encounter for observation for suspected exposure to other biological agents ruled out: Secondary | ICD-10-CM | POA: Diagnosis not present

## 2020-07-27 ENCOUNTER — Ambulatory Visit (INDEPENDENT_AMBULATORY_CARE_PROVIDER_SITE_OTHER): Payer: 59 | Admitting: Dermatology

## 2020-07-27 ENCOUNTER — Other Ambulatory Visit: Payer: Self-pay

## 2020-07-27 DIAGNOSIS — L820 Inflamed seborrheic keratosis: Secondary | ICD-10-CM | POA: Diagnosis not present

## 2020-07-27 DIAGNOSIS — L570 Actinic keratosis: Secondary | ICD-10-CM | POA: Diagnosis not present

## 2020-07-27 DIAGNOSIS — L578 Other skin changes due to chronic exposure to nonionizing radiation: Secondary | ICD-10-CM | POA: Diagnosis not present

## 2020-07-27 NOTE — Progress Notes (Signed)
Follow-Up Visit   Subjective  Tonya Leonard is a 45 y.o. female who presents for the following: Follow-up (Biopsy proven Lichenoid AK left medial post shoulder 12-01-2019) and Actinic Keratosis (Recheck AK treated on the right nasal tip 12-01-2019).  The following portions of the chart were reviewed this encounter and updated as appropriate:   Tobacco  Allergies  Meds  Problems  Med Hx  Surg Hx  Fam Hx     Review of Systems:  No other skin or systemic complaints except as noted in HPI or Assessment and Plan.  Objective  Well appearing patient in no apparent distress; mood and affect are within normal limits.  A focused examination was performed including face,back,chest . Relevant physical exam findings are noted in the Assessment and Plan.  Objective  Nose x 1, left shoulder x 1 (2): Erythematous thin papules/macules with gritty scale.   Objective  Right deltoid: Erythematous keratotic or waxy stuck-on papule or plaque.    Assessment & Plan  AK (actinic keratosis) (2) Nose x 1, left shoulder x 1  Biopsy proven Lichenoid Ak Left medial post shoulder treated with LN2   Actinic keratoses are precancerous spots that appear secondary to cumulative UV radiation exposure/sun exposure over time. They are chronic with expected duration over 1 year. A portion of actinic keratoses will progress to squamous cell carcinoma of the skin. It is not possible to reliably predict which spots will progress to skin cancer and so treatment is recommended to prevent development of skin cancer.  Recommend daily broad spectrum sunscreen SPF 30+ to sun-exposed areas, reapply every 2 hours as needed.  Recommend staying in the shade or wearing long sleeves, sun glasses (UVA+UVB protection) and wide brim hats (4-inch brim around the entire circumference of the hat). Call for new or changing lesions.   Destruction of lesion - Nose x 1, left shoulder x 1 Complexity: simple   Destruction method:  cryotherapy   Informed consent: discussed and consent obtained   Timeout:  patient name, date of birth, surgical site, and procedure verified Lesion destroyed using liquid nitrogen: Yes   Region frozen until ice ball extended beyond lesion: Yes   Outcome: patient tolerated procedure well with no complications   Post-procedure details: wound care instructions given    Ordered Medications: fluorouracil (EFUDEX) 5 % cream  Inflamed seborrheic keratosis Right deltoid  Destruction of lesion - Right deltoid Complexity: simple   Destruction method: cryotherapy   Informed consent: discussed and consent obtained   Timeout:  patient name, date of birth, surgical site, and procedure verified Lesion destroyed using liquid nitrogen: Yes   Region frozen until ice ball extended beyond lesion: Yes   Outcome: patient tolerated procedure well with no complications   Post-procedure details: wound care instructions given     Actinic Damage - chronic, secondary to cumulative UV radiation exposure/sun exposure over time - diffuse scaly erythematous macules with underlying dyspigmentation - Recommend daily broad spectrum sunscreen SPF 30+ to sun-exposed areas, reapply every 2 hours as needed.  - Recommend staying in the shade or wearing long sleeves, sun glasses (UVA+UVB protection) and wide brim hats (4-inch brim around the entire circumference of the hat). - Call for new or changing lesions.  Actinic Damage - Severe, confluent actinic changes with pre-cancerous actinic keratoses  - Severe, chronic, not at goal, secondary to cumulative UV radiation exposure over time - diffuse scaly erythematous macules and papules with underlying dyspigmentation - Discussed Prescription "Field Treatment" for Severe, Chronic Confluent  Actinic Changes with Pre-Cancerous Actinic Keratoses Start 69fU/Calcipotriene cream apply to nose twice a day  In 3-4 weeks  Field treatment involves treatment of an entire area of skin  that has confluent Actinic Changes (Sun/ Ultraviolet light damage) and PreCancerous Actinic Keratoses by method of PhotoDynamic Therapy (PDT) and/or prescription Topical Chemotherapy agents such as 5-fluorouracil, 5-fluorouracil/calcipotriene, and/or imiquimod.  The purpose is to decrease the number of clinically evident and subclinical PreCancerous lesions to prevent progression to development of skin cancer by chemically destroying early precancer changes that may or may not be visible.  It has been shown to reduce the risk of developing skin cancer in the treated area. As a result of treatment, redness, scaling, crusting, and open sores may occur during treatment course. One or more than one of these methods may be used and may have to be used several times to control, suppress and eliminate the PreCancerous changes. Discussed treatment course, expected reaction, and possible side effects. - Recommend daily broad spectrum sunscreen SPF 30+ to sun-exposed areas, reapply every 2 hours as needed.  - Staying in the shade or wearing long sleeves, sun glasses (UVA+UVB protection) and wide brim hats (4-inch brim around the entire circumference of the hat) are also recommended. - Call for new or changing lesions.  Return in about 6 months (around 01/27/2021) for AKs, ISK's.   IMarye Round, CMA, am acting as scribe for Sarina Ser, MD .  Documentation: I have reviewed the above documentation for accuracy and completeness, and I agree with the above.  Sarina Ser, MD

## 2020-07-27 NOTE — Patient Instructions (Signed)

## 2020-07-27 NOTE — Progress Notes (Deleted)
   Follow-Up Visit   Subjective  Tonya Leonard is a 45 y.o. female who presents for the following: Follow-up (Biopsy proven Lichenoid AK left medial post shoulder 12-01-2019) and Actinic Keratosis (Recheck AK treated on the right nasal tip 12-01-2019).   The following portions of the chart were reviewed this encounter and updated as appropriate:       Review of Systems:  No other skin or systemic complaints except as noted in HPI or Assessment and Plan.  Objective  Well appearing patient in no apparent distress; mood and affect are within normal limits.  {[id number]::"V full examination was performed including scalp, head, eyes, ears, nose, lips, neck, chest, axillae, abdomen, back, buttocks, bilateral upper extremities, bilateral lower extremities, hands, feet, fingers, toes, fingernails, and toenails. All findings within normal limits unless otherwise noted below."}    Assessment & Plan   Return in about 6 months (around 01/27/2021).

## 2020-07-28 MED ORDER — FLUOROURACIL 5 % EX CREA
TOPICAL_CREAM | Freq: Two times a day (BID) | CUTANEOUS | 1 refills | Status: DC
Start: 2020-07-28 — End: 2021-01-17

## 2020-07-30 ENCOUNTER — Encounter: Payer: Self-pay | Admitting: Dermatology

## 2020-08-17 DIAGNOSIS — M9903 Segmental and somatic dysfunction of lumbar region: Secondary | ICD-10-CM | POA: Diagnosis not present

## 2020-08-17 DIAGNOSIS — M542 Cervicalgia: Secondary | ICD-10-CM | POA: Diagnosis not present

## 2020-08-17 DIAGNOSIS — M62830 Muscle spasm of back: Secondary | ICD-10-CM | POA: Diagnosis not present

## 2020-08-17 DIAGNOSIS — M9901 Segmental and somatic dysfunction of cervical region: Secondary | ICD-10-CM | POA: Diagnosis not present

## 2020-10-12 ENCOUNTER — Other Ambulatory Visit: Payer: Self-pay | Admitting: Family Medicine

## 2020-12-06 ENCOUNTER — Encounter: Payer: 59 | Admitting: Dermatology

## 2021-01-10 ENCOUNTER — Ambulatory Visit: Payer: 59 | Admitting: Dermatology

## 2021-01-17 ENCOUNTER — Other Ambulatory Visit: Payer: Self-pay

## 2021-01-17 ENCOUNTER — Ambulatory Visit: Payer: 59 | Admitting: Physician Assistant

## 2021-01-17 DIAGNOSIS — J020 Streptococcal pharyngitis: Secondary | ICD-10-CM

## 2021-01-17 DIAGNOSIS — Z1152 Encounter for screening for COVID-19: Secondary | ICD-10-CM

## 2021-01-17 DIAGNOSIS — R6889 Other general symptoms and signs: Secondary | ICD-10-CM

## 2021-01-17 LAB — POCT INFLUENZA A/B
Influenza A, POC: NEGATIVE
Influenza B, POC: NEGATIVE

## 2021-01-17 LAB — POCT RAPID STREP A (OFFICE): Rapid Strep A Screen: NEGATIVE

## 2021-01-17 LAB — POC COVID19 BINAXNOW: SARS Coronavirus 2 Ag: NEGATIVE

## 2021-01-17 NOTE — Progress Notes (Deleted)
Pt symptoms started 01/13/21 Sore throat, cough, body ache and low fever. Pt states she can see an ulcer on the right side of throat. Due to weather out side I was unable to see her throat well just enough to swab it. The place on her throat is so painful and scratchy it makes her cough more and vomits her food this happened yesterday.   Rapid covid : negative FLU: Negative A&B Rapid Strep : Negative  .Burna Sis

## 2021-01-17 NOTE — Progress Notes (Signed)
Pt symptoms started a week and half ago. Sore throat, cough, body ache and low fever. Pt states she can see an ulcer on the right side of throat. Due to weather out side I was unable to see her throat well just enough to swab it. The place on her throat is so painful and scratchy it makes her cough more and vomits her food this happened yesterday./CL,RMA

## 2021-01-18 ENCOUNTER — Ambulatory Visit: Payer: Self-pay | Admitting: Physician Assistant

## 2021-01-18 ENCOUNTER — Ambulatory Visit: Payer: 59 | Admitting: Physician Assistant

## 2021-01-18 ENCOUNTER — Encounter: Payer: Self-pay | Admitting: Physician Assistant

## 2021-01-18 ENCOUNTER — Other Ambulatory Visit: Payer: Self-pay

## 2021-01-18 VITALS — BP 130/82 | HR 100 | Temp 97.7°F | Resp 20 | Ht 71.0 in | Wt 164.0 lb

## 2021-01-18 DIAGNOSIS — B9789 Other viral agents as the cause of diseases classified elsewhere: Secondary | ICD-10-CM

## 2021-01-18 DIAGNOSIS — J988 Other specified respiratory disorders: Secondary | ICD-10-CM

## 2021-01-18 MED ORDER — METHYLPREDNISOLONE 4 MG PO TBPK: ORAL_TABLET | ORAL | 0 refills | Status: DC

## 2021-01-18 MED ORDER — PSEUDOEPH-BROMPHEN-DM 30-2-10 MG/5ML PO SYRP: 5.0000 mL | ORAL_SOLUTION | Freq: Four times a day (QID) | ORAL | 0 refills | Status: DC | PRN

## 2021-01-18 MED ORDER — LIDOCAINE VISCOUS HCL 2 % MT SOLN: 5.0000 mL | Freq: Four times a day (QID) | OROMUCOSAL | 0 refills | Status: DC | PRN

## 2021-01-18 NOTE — Progress Notes (Signed)
Hurts to take deep breaths - deeper the breath, the more it makes her cough - coughing to the point of vomiting.. Sore throat  Originally started on Halloween & lasted about a week.  This time it started on Friday 01/13/21: Bodyaches, cough, low grade fever, sore throat Lymph nodes in neck feel sore -especially on right side  Sinus congestion Denies headache  Ears feel full No discomfort to teeth  Rapid Covid, Rapid Flu & Rapid Strep = Negative Yesterday in our clinic  States this isn't like a typical sinus infection Concerned because she has been checking her O2 levels at home with oximetry & has been down to 85 - shallow breaths because the deep breaths made her start coughing.  AMD

## 2021-01-18 NOTE — Progress Notes (Signed)
   Subjective: Cough and sore throat    Patient ID: Tonya Leonard, female    DOB: Oct 23, 1975, 45 y.o.   MRN: [id number]  HPI Patient presents with 1 week of sinus congestion, nonproductive cough, sore throat, decreased voice volume.  Patient states cough increased with deep inspirations.  Patient has a pulse oximeter at home and states that she noticed decreased activity levels.  Patient admits to taking shallow breaths secondary to coughing spells.  Patient noticed that when she take a deep breath oxygen levels returned to normal range.  Patient tested negative for influenza and strep pharyngitis yesterday.  Patient has taken COVID-vaccine with boosters.   Review of Systems DU B and ovarian cysts    Objective:   Physical Exam No acute distress.  Temperature is 97.7, pulse 100, respiration 20, BP is 130/82, patient 99% O2 sat on room air.  Patient was on 64 pounds BMI is 22.9. HEENT remarkable edematous nasal turbinates, postnasal drainage, and decreased voice volume.  Neck is supple with right lymphadenopathy.  Lungs are clear to auscultation.  Heart regular rate and rhythm.       Assessment & Plan: URI with cough and laryngitis.   Discussed rationale for not starting antibiotics for viral illness.  Patient given discharge care instruction.  Patient given a prescription of Bromfed-DM to mix with viscous lidocaine for swish and swallow.  Patient advised voice rest and given a prescription for Medrol Dosepak.  Patient advised to follow-up telephonically in 2 days.

## 2021-01-19 ENCOUNTER — Other Ambulatory Visit: Payer: Self-pay

## 2021-01-19 DIAGNOSIS — B9789 Other viral agents as the cause of diseases classified elsewhere: Secondary | ICD-10-CM

## 2021-01-19 MED ORDER — LIDOCAINE VISCOUS HCL 2 % MT SOLN
5.0000 mL | Freq: Four times a day (QID) | OROMUCOSAL | 0 refills | Status: DC | PRN
Start: 2021-01-19 — End: 2021-04-05

## 2021-03-14 ENCOUNTER — Ambulatory Visit (INDEPENDENT_AMBULATORY_CARE_PROVIDER_SITE_OTHER): Payer: 59 | Admitting: *Deleted

## 2021-03-14 ENCOUNTER — Other Ambulatory Visit: Payer: Self-pay

## 2021-03-14 VITALS — BP 128/77 | HR 83

## 2021-03-14 DIAGNOSIS — R3915 Urgency of urination: Secondary | ICD-10-CM

## 2021-03-14 LAB — POCT URINALYSIS DIPSTICK
Leukocytes, UA: NEGATIVE
Nitrite, UA: NEGATIVE

## 2021-03-14 NOTE — Progress Notes (Signed)
SUBJECTIVE: Tonya Leonard is a 46 y.o. female who complains of urinary urgency, and does notice bleeding when she uses the bathroom.  Pt unsure if previous UTI never cleared up, but states something is not right. Denies flank pain, fever, chills, or abnormal vaginal discharge or bleeding.   OBJECTIVE: Appears well, in no apparent distress.  Vital signs are normal. Urine dipstick shows positive for RBC's.    ASSESSMENT: Urinary urgency  PLAN: Will send for culture, and treat if needed. Pt has appt with Dr Kennon Rounds on 2/1 and will discuss these issues as well. Marland Kitchen

## 2021-03-15 NOTE — Progress Notes (Signed)
Patient was assessed and managed by nursing staff during this encounter. I have reviewed the chart and agree with the documentation and plan.   Verita Schneiders, MD 03/15/2021 9:06 AM

## 2021-03-16 DIAGNOSIS — Z1231 Encounter for screening mammogram for malignant neoplasm of breast: Secondary | ICD-10-CM | POA: Diagnosis not present

## 2021-03-16 LAB — URINE CULTURE

## 2021-04-05 ENCOUNTER — Encounter: Payer: Self-pay | Admitting: Family Medicine

## 2021-04-05 ENCOUNTER — Other Ambulatory Visit: Payer: Self-pay

## 2021-04-05 ENCOUNTER — Ambulatory Visit (INDEPENDENT_AMBULATORY_CARE_PROVIDER_SITE_OTHER): Payer: 59 | Admitting: Family Medicine

## 2021-04-05 ENCOUNTER — Other Ambulatory Visit (HOSPITAL_COMMUNITY)
Admission: RE | Admit: 2021-04-05 | Discharge: 2021-04-05 | Disposition: A | Discharge status: Home / Self Care | Payer: 59 | Source: Ambulatory Visit | Attending: Family Medicine | Admitting: Family Medicine

## 2021-04-05 VITALS — BP 120/79 | HR 94 | Wt 164.0 lb

## 2021-04-05 DIAGNOSIS — N939 Abnormal uterine and vaginal bleeding, unspecified: Secondary | ICD-10-CM

## 2021-04-05 DIAGNOSIS — B001 Herpesviral vesicular dermatitis: Secondary | ICD-10-CM | POA: Insufficient documentation

## 2021-04-05 DIAGNOSIS — Z0001 Encounter for general adult medical examination with abnormal findings: Secondary | ICD-10-CM | POA: Insufficient documentation

## 2021-04-05 DIAGNOSIS — Z124 Encounter for screening for malignant neoplasm of cervix: Secondary | ICD-10-CM | POA: Insufficient documentation

## 2021-04-05 DIAGNOSIS — Z01411 Encounter for gynecological examination (general) (routine) with abnormal findings: Secondary | ICD-10-CM | POA: Diagnosis not present

## 2021-04-05 DIAGNOSIS — N898 Other specified noninflammatory disorders of vagina: Secondary | ICD-10-CM | POA: Insufficient documentation

## 2021-04-05 DIAGNOSIS — R300 Dysuria: Secondary | ICD-10-CM | POA: Insufficient documentation

## 2021-04-05 DIAGNOSIS — N925 Other specified irregular menstruation: Secondary | ICD-10-CM | POA: Insufficient documentation

## 2021-04-05 LAB — COMPREHENSIVE METABOLIC PANEL
ALT: 14 IU/L (ref 0–32)
AST: 19 IU/L (ref 0–40)
Albumin/Globulin Ratio: 1.7 (ref 1.2–2.2)
Albumin: 4.8 g/dL (ref 3.8–4.8)
Alkaline Phosphatase: 89 IU/L (ref 44–121)
BUN/Creatinine Ratio: 17 (ref 9–23)
BUN: 12 mg/dL (ref 6–24)
Bilirubin Total: 0.4 mg/dL (ref 0.0–1.2)
CO2: 24 mmol/L (ref 20–29)
Calcium: 9.7 mg/dL (ref 8.7–10.2)
Chloride: 103 mmol/L (ref 96–106)
Creatinine, Ser: 0.71 mg/dL (ref 0.57–1.00)
Globulin, Total: 2.9 g/dL (ref 1.5–4.5)
Glucose: 99 mg/dL (ref 70–99)
Potassium: 4.5 mmol/L (ref 3.5–5.2)
Sodium: 141 mmol/L (ref 134–144)
Total Protein: 7.7 g/dL (ref 6.0–8.5)
eGFR: 105 mL/min/{1.73_m2} (ref >59)

## 2021-04-05 LAB — CBC
Hematocrit: 37.8 % (ref 34.0–46.6)
Hemoglobin: 12 g/dL (ref 11.1–15.9)
MCH: 27.6 pg (ref 26.6–33.0)
MCHC: 31.7 g/dL (ref 31.5–35.7)
MCV: 87 fL (ref 79–97)
Platelets: 258 10*3/uL (ref 150–450)
RBC: 4.35 x10E6/uL (ref 3.77–5.28)
RDW: 14.6 % (ref 11.7–15.4)
WBC: 5.7 10*3/uL (ref 3.4–10.8)

## 2021-04-05 LAB — POCT URINALYSIS DIPSTICK
Appearance: NORMAL
Bilirubin, UA: NEGATIVE
Blood, UA: NEGATIVE
Glucose, UA: NEGATIVE
Ketones, UA: NEGATIVE
Leukocytes, UA: NEGATIVE
Nitrite, UA: NEGATIVE
Protein, UA: NEGATIVE
Spec Grav, UA: 1.015 (ref 1.010–1.025)
Urobilinogen, UA: NEGATIVE E.U./dL — AB
pH, UA: 7 (ref 5.0–8.0)

## 2021-04-05 LAB — TSH: TSH: 1.75 u[IU]/mL (ref 0.450–4.500)

## 2021-04-05 LAB — LIPID PANEL
Chol/HDL Ratio: 3.5 ratio (ref 0.0–4.4)
Cholesterol, Total: 232 mg/dL — ABNORMAL HIGH (ref 100–199)
HDL: 66 mg/dL (ref >39)
LDL Chol Calc (NIH): 152 mg/dL — ABNORMAL HIGH (ref 0–99)
Triglycerides: 82 mg/dL (ref 0–149)
VLDL Cholesterol Cal: 14 mg/dL (ref 5–40)

## 2021-04-05 LAB — HEMOGLOBIN A1C
Est. average glucose Bld gHb Est-mCnc: 108 mg/dL
Hgb A1c MFr Bld: 5.4 % (ref 4.8–5.6)

## 2021-04-05 MED ORDER — DOXYCYCLINE HYCLATE 100 MG PO CAPS: 100.0000 mg | ORAL_CAPSULE | Freq: Two times a day (BID) | ORAL | 0 refills | Status: DC

## 2021-04-05 MED ORDER — VALACYCLOVIR HCL 1 G PO TABS: 1000.0000 mg | ORAL_TABLET | Freq: Every day | ORAL | 2 refills | Status: DC

## 2021-04-05 NOTE — Progress Notes (Signed)
Getting diagnostic mammo with [facility] this Friday   Having pink spotting, thinks bladder may be leaking

## 2021-04-05 NOTE — Progress Notes (Signed)
Subjective:     Tonya Leonard is a 46 y.o. female and is here for a comprehensive physical exam. The patient reports problems - vaginal discharge and dysuria . Cycles have been different. She is going months between cycles. Having some pink vaginal discharge, worse after intercourse. Some vaginal burning. Feels like she is leaking urine from time to time, can happen without stress or urge.  The following portions of the patient's history were reviewed and updated as appropriate: allergies, current medications, past family history, past medical history, past social history, past surgical history, and problem list.  Review of Systems Pertinent items noted in HPI and remainder of comprehensive ROS otherwise negative.   Objective:    BP 120/79    Pulse 94    Wt 164 lb (74.4 kg)    BMI 22.87 kg/m  General appearance: alert, cooperative, and appears stated age Head: Normocephalic, without obvious abnormality, atraumatic Neck: no adenopathy, supple, symmetrical, trachea midline, and thyroid not enlarged, symmetric, no tenderness/mass/nodules Lungs: clear to auscultation bilaterally Breasts: normal appearance, no masses or tenderness Heart: regular rate and rhythm, S1, S2 normal, no murmur, click, rub or gallop Abdomen: soft, non-tender; bowel sounds normal; no masses,  no organomegaly Pelvic: cervix normal in appearance, external genitalia normal, no adnexal masses or tenderness, no cervical motion tenderness, uterus normal size, shape, and consistency, and vagina normal without discharge Extremities: extremities normal, atraumatic, no cyanosis or edema Pulses: 2+ and symmetric Skin: Skin color, texture, turgor normal. No rashes or lesions Lymph nodes: Cervical, supraclavicular, and axillary nodes normal. Neurologic: Grossly normal    Urinalysis    Component Value Date/Time   BILIRUBINUR neg 04/05/2021 1157   PROTEINUR Negative 04/05/2021 1157   UROBILINOGEN negative (A) 04/05/2021 1157    NITRITE neg 04/05/2021 1157   LEUKOCYTESUR Negative 04/05/2021 1157     Assessment:    GYN female exam.     Plan:   Problem List Items Addressed This Visit       Unprioritized   Abnormal uterine bleeding    99214 Having this vaginal discharge and sx's of UTI, w/ neg cx. Check wet prep and negative u/a today. Presumptive treatment of endometritis.      Relevant Medications   doxycycline (VIBRAMYCIN) 100 MG capsule   Fever blister    99214 - med refill      Relevant Medications   valACYclovir (VALTREX) 1000 MG tablet   Other Visit Diagnoses     Screening for malignant neoplasm of cervix    -  Primary   [id number] - desires annual pap smear   Relevant Orders   Cytology - PAP   Encounter for gynecological examination with abnormal finding       99386 - scheduled for mammogram   Relevant Orders   CBC   TSH   Hemoglobin A1c   Comprehensive metabolic panel   Lipid panel   Cervicovaginal ancillary only( [place])   POCT Urinalysis Dipstick (Completed)      Return in 1 year (on 04/05/2022) for and sooner if symptoms do not improve--for possible referral to urology or urogyn.    See After Visit Summary for Counseling Recommendations

## 2021-04-05 NOTE — Assessment & Plan Note (Signed)
63149 Having this vaginal discharge and sx's of UTI, w/ neg cx. Check wet prep and negative u/a today. Presumptive treatment of endometritis.

## 2021-04-05 NOTE — Assessment & Plan Note (Signed)
51982 - med refill

## 2021-04-06 ENCOUNTER — Encounter: Payer: Self-pay | Admitting: Family Medicine

## 2021-04-06 LAB — CERVICOVAGINAL ANCILLARY ONLY
Bacterial Vaginitis (gardnerella): NEGATIVE
Candida Glabrata: NEGATIVE
Candida Vaginitis: NEGATIVE
Comment: NEGATIVE
Comment: NEGATIVE
Comment: NEGATIVE

## 2021-04-07 DIAGNOSIS — N6320 Unspecified lump in the left breast, unspecified quadrant: Secondary | ICD-10-CM | POA: Diagnosis not present

## 2021-04-07 DIAGNOSIS — R928 Other abnormal and inconclusive findings on diagnostic imaging of breast: Secondary | ICD-10-CM | POA: Diagnosis not present

## 2021-04-07 DIAGNOSIS — R921 Mammographic calcification found on diagnostic imaging of breast: Secondary | ICD-10-CM | POA: Diagnosis not present

## 2021-04-07 LAB — CYTOLOGY - PAP
Comment: NEGATIVE
Diagnosis: NEGATIVE
Diagnosis: REACTIVE
High risk HPV: NEGATIVE

## 2021-04-09 ENCOUNTER — Encounter: Payer: Self-pay | Admitting: Radiology

## 2021-04-26 ENCOUNTER — Encounter: Payer: Self-pay | Admitting: Family Medicine

## 2021-04-26 DIAGNOSIS — B001 Herpesviral vesicular dermatitis: Secondary | ICD-10-CM

## 2021-04-26 MED ORDER — VALACYCLOVIR HCL 500 MG PO TABS
1000.0000 mg | ORAL_TABLET | Freq: Every day | ORAL | 2 refills | Status: DC
Start: 2021-04-26 — End: 2023-07-31

## 2021-05-12 ENCOUNTER — Encounter: Payer: Self-pay | Admitting: Family Medicine

## 2021-05-15 MED ORDER — PROBIOTIC DIGESTIVE SUPPORT PO CAPS: 1.0000 | ORAL_CAPSULE | Freq: Every day | ORAL | 1 refills | Status: DC

## 2021-09-27 DIAGNOSIS — R922 Inconclusive mammogram: Secondary | ICD-10-CM | POA: Diagnosis not present

## 2021-09-27 DIAGNOSIS — R928 Other abnormal and inconclusive findings on diagnostic imaging of breast: Secondary | ICD-10-CM | POA: Diagnosis not present

## 2022-04-04 ENCOUNTER — Inpatient Hospital Stay
Admission: RE | Admit: 2022-04-04 | Discharge: 2022-04-04 | Disposition: A | Discharge status: Home / Self Care | Payer: Self-pay | Source: Ambulatory Visit | Attending: *Deleted | Admitting: *Deleted

## 2022-04-04 ENCOUNTER — Other Ambulatory Visit: Payer: Self-pay | Admitting: *Deleted

## 2022-04-04 DIAGNOSIS — Z1231 Encounter for screening mammogram for malignant neoplasm of breast: Secondary | ICD-10-CM

## 2022-04-27 DIAGNOSIS — R928 Other abnormal and inconclusive findings on diagnostic imaging of breast: Secondary | ICD-10-CM | POA: Diagnosis not present

## 2022-04-27 DIAGNOSIS — N6311 Unspecified lump in the right breast, upper outer quadrant: Secondary | ICD-10-CM | POA: Diagnosis not present

## 2022-04-27 DIAGNOSIS — N6314 Unspecified lump in the right breast, lower inner quadrant: Secondary | ICD-10-CM | POA: Diagnosis not present

## 2022-04-27 DIAGNOSIS — R921 Mammographic calcification found on diagnostic imaging of breast: Secondary | ICD-10-CM | POA: Diagnosis not present

## 2022-05-09 ENCOUNTER — Ambulatory Visit (INDEPENDENT_AMBULATORY_CARE_PROVIDER_SITE_OTHER): Payer: 59 | Admitting: Family Medicine

## 2022-05-09 ENCOUNTER — Encounter: Payer: Self-pay | Admitting: Family Medicine

## 2022-05-09 ENCOUNTER — Other Ambulatory Visit (HOSPITAL_COMMUNITY)
Admission: RE | Admit: 2022-05-09 | Discharge: 2022-05-09 | Disposition: A | Discharge status: Home / Self Care | Payer: 59 | Source: Ambulatory Visit | Attending: Family Medicine | Admitting: Family Medicine

## 2022-05-09 VITALS — BP 108/75 | HR 106 | Ht 71.0 in | Wt 166.0 lb

## 2022-05-09 DIAGNOSIS — Z1339 Encounter for screening examination for other mental health and behavioral disorders: Secondary | ICD-10-CM | POA: Diagnosis not present

## 2022-05-09 DIAGNOSIS — Z124 Encounter for screening for malignant neoplasm of cervix: Secondary | ICD-10-CM | POA: Insufficient documentation

## 2022-05-09 DIAGNOSIS — N6081 Other benign mammary dysplasias of right breast: Secondary | ICD-10-CM | POA: Diagnosis not present

## 2022-05-09 DIAGNOSIS — N6311 Unspecified lump in the right breast, upper outer quadrant: Secondary | ICD-10-CM | POA: Diagnosis not present

## 2022-05-09 DIAGNOSIS — N951 Menopausal and female climacteric states: Secondary | ICD-10-CM | POA: Diagnosis not present

## 2022-05-09 DIAGNOSIS — Z01419 Encounter for gynecological examination (general) (routine) without abnormal findings: Secondary | ICD-10-CM

## 2022-05-09 DIAGNOSIS — N6011 Diffuse cystic mastopathy of right breast: Secondary | ICD-10-CM | POA: Diagnosis not present

## 2022-05-09 DIAGNOSIS — N6314 Unspecified lump in the right breast, lower inner quadrant: Secondary | ICD-10-CM | POA: Diagnosis not present

## 2022-05-09 NOTE — Assessment & Plan Note (Signed)
Discussed risks of HRT--and weight and mood changes. She declines due to risk of breast cancer.  To add weight bearing exercises to help with metabolism.

## 2022-05-09 NOTE — Progress Notes (Signed)
Here today for annual exam.  Would like pap smear.  Periods are coming about every three months.

## 2022-05-09 NOTE — Progress Notes (Signed)
Subjective:     Tonya Leonard is a 47 y.o. female and is here for a comprehensive physical exam. The patient reports problems - cycles are light, and come every 3 months.  Trouble losing weight. Has trouble sleeping and increased stress.  The following portions of the patient's history were reviewed and updated as appropriate: allergies, current medications, past family history, past medical history, past social history, past surgical history, and problem list.  Review of Systems Pertinent items noted in HPI and remainder of comprehensive ROS otherwise negative.   Objective:  Chaperone present for exam   BP 108/75   Pulse (!) 106   Ht '5\' 11"'$  (1.803 m)   Wt 166 lb (75.3 kg)   LMP  (LMP Unknown)   BMI 23.15 kg/m  General appearance: alert, cooperative, and appears stated age Head: Normocephalic, without obvious abnormality, atraumatic Neck: no adenopathy, supple, symmetrical, trachea midline, and thyroid not enlarged, symmetric, no tenderness/mass/nodules Lungs: clear to auscultation bilaterally Breasts: normal appearance, no masses or tenderness Heart: regular rate and rhythm, S1, S2 normal, no murmur, click, rub or gallop Abdomen: soft, non-tender; bowel sounds normal; no masses,  no organomegaly Pelvic: cervix normal in appearance, external genitalia normal, no adnexal masses or tenderness, no cervical motion tenderness, uterus normal size, shape, and consistency, and vagina normal without discharge Extremities: extremities normal, atraumatic, no cyanosis or edema Pulses: 2+ and symmetric Skin: Skin color, texture, turgor normal. No rashes or lesions Lymph nodes: Cervical, supraclavicular, and axillary nodes normal. Neurologic: Grossly normal    Assessment:    Healthy female exam.      Plan:   Problem List Items Addressed This Visit       Unprioritized   Perimenopause    Discussed risks of HRT--and weight and mood changes. She declines due to risk of breast cancer.  To  add weight bearing exercises to help with metabolism.      Other Visit Diagnoses     Encounter for gynecological examination without abnormal finding    -  Primary   for breast biopsies today at [facility]   Relevant Orders   CBC   Comprehensive metabolic panel   Hemoglobin A1c   TSH   Lipid panel   Screening for malignant neoplasm of cervix       Relevant Orders   Cytology - PAP( [place])      Return in 1 year (on 05/09/2023).    See After Visit Summary for Counseling Recommendations

## 2022-05-10 LAB — COMPREHENSIVE METABOLIC PANEL
ALT: 15 IU/L (ref 0–32)
AST: 14 IU/L (ref 0–40)
Albumin/Globulin Ratio: 1.9 (ref 1.2–2.2)
Albumin: 4.8 g/dL (ref 3.9–4.9)
Alkaline Phosphatase: 94 IU/L (ref 44–121)
BUN/Creatinine Ratio: 27 — ABNORMAL HIGH (ref 9–23)
BUN: 18 mg/dL (ref 6–24)
Bilirubin Total: 0.6 mg/dL (ref 0.0–1.2)
CO2: 23 mmol/L (ref 20–29)
Calcium: 9.3 mg/dL (ref 8.7–10.2)
Chloride: 101 mmol/L (ref 96–106)
Creatinine, Ser: 0.66 mg/dL (ref 0.57–1.00)
Globulin, Total: 2.5 g/dL (ref 1.5–4.5)
Glucose: 89 mg/dL (ref 70–99)
Potassium: 4 mmol/L (ref 3.5–5.2)
Sodium: 139 mmol/L (ref 134–144)
Total Protein: 7.3 g/dL (ref 6.0–8.5)
eGFR: 108 mL/min/{1.73_m2} (ref >59)

## 2022-05-10 LAB — LIPID PANEL
Chol/HDL Ratio: 3.6 ratio (ref 0.0–4.4)
Cholesterol, Total: 208 mg/dL — ABNORMAL HIGH (ref 100–199)
HDL: 57 mg/dL (ref >39)
LDL Chol Calc (NIH): 133 mg/dL — ABNORMAL HIGH (ref 0–99)
Triglycerides: 100 mg/dL (ref 0–149)
VLDL Cholesterol Cal: 18 mg/dL (ref 5–40)

## 2022-05-10 LAB — CBC
Hematocrit: 41.7 % (ref 34.0–46.6)
Hemoglobin: 13.6 g/dL (ref 11.1–15.9)
MCH: 28.4 pg (ref 26.6–33.0)
MCHC: 32.6 g/dL (ref 31.5–35.7)
MCV: 87 fL (ref 79–97)
Platelets: 207 10*3/uL (ref 150–450)
RBC: 4.79 x10E6/uL (ref 3.77–5.28)
RDW: 16.3 % — ABNORMAL HIGH (ref 11.7–15.4)
WBC: 5.1 10*3/uL (ref 3.4–10.8)

## 2022-05-10 LAB — TSH: TSH: 1.59 u[IU]/mL (ref 0.450–4.500)

## 2022-05-10 LAB — HEMOGLOBIN A1C
Est. average glucose Bld gHb Est-mCnc: 111 mg/dL
Hgb A1c MFr Bld: 5.5 % (ref 4.8–5.6)

## 2022-05-11 ENCOUNTER — Encounter: Payer: Self-pay | Admitting: Family Medicine

## 2022-05-14 LAB — CYTOLOGY - PAP
Comment: NEGATIVE
Diagnosis: NEGATIVE
High risk HPV: NEGATIVE

## 2022-09-04 IMAGING — US US PELVIS COMPLETE WITH TRANSVAGINAL
1 series · 13 of 25 positions shown · non-contrast
Comparison: 11/01/2019

CLINICAL DATA: Left ovarian cyst, follow-up examination

EXAM:
TRANSABDOMINAL AND TRANSVAGINAL ULTRASOUND OF PELVIS
TECHNIQUE: Both transabdominal and transvaginal ultrasound examinations of the
pelvis were performed. Transabdominal technique was performed for
global imaging of the pelvis including uterus, ovaries, adnexal
regions, and pelvic cul-de-sac. It was necessary to proceed with
endovaginal exam following the transabdominal exam to visualize the
left ovarian cystic lesion.

[Series 1: us pelvic complete with transvaginal · 132 acquisitions, 13 frames shown]
[im 1/132]
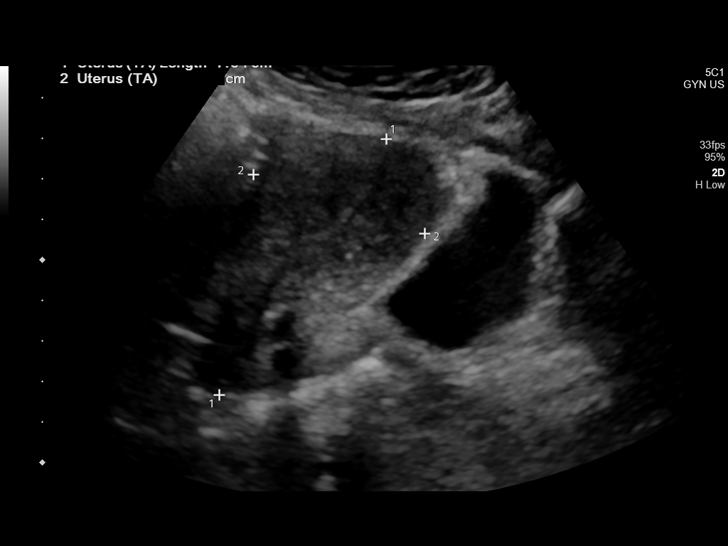
[im 11/132]
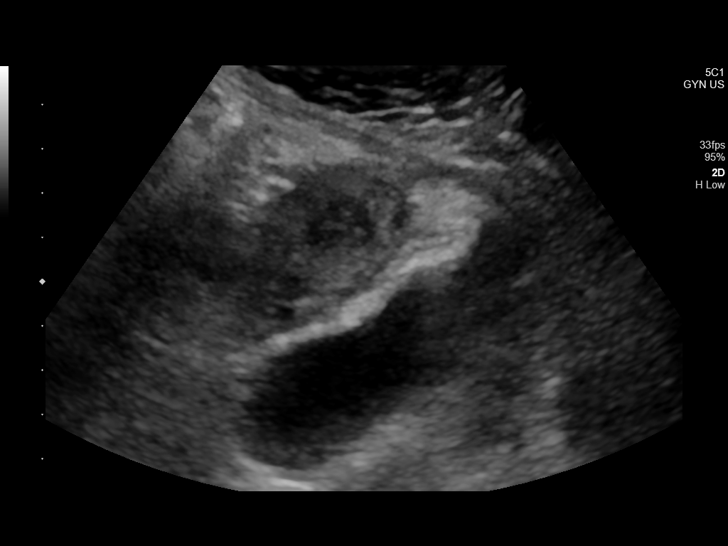
[im 22/132]
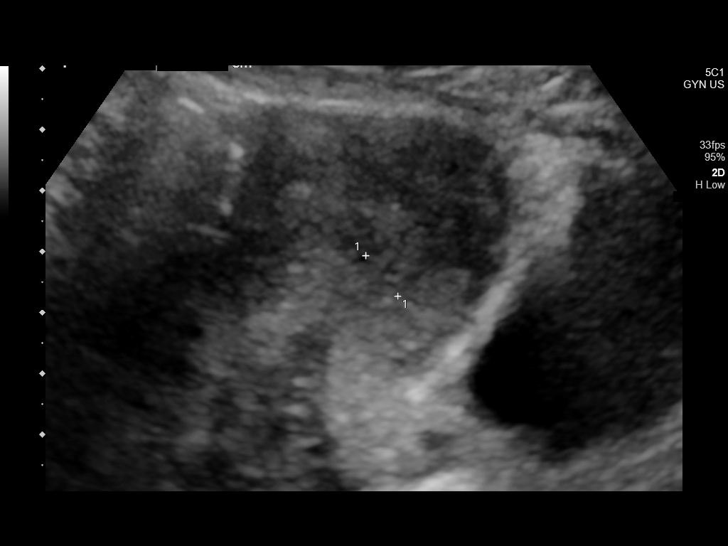
[im 33/132]
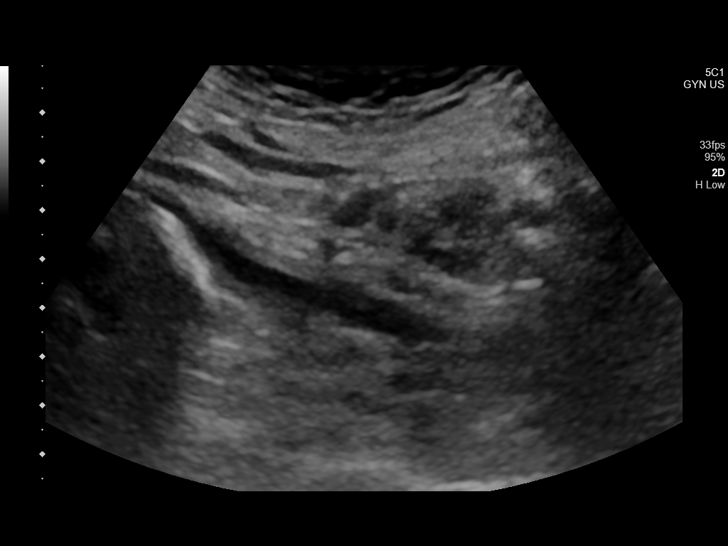
[im 44/132]
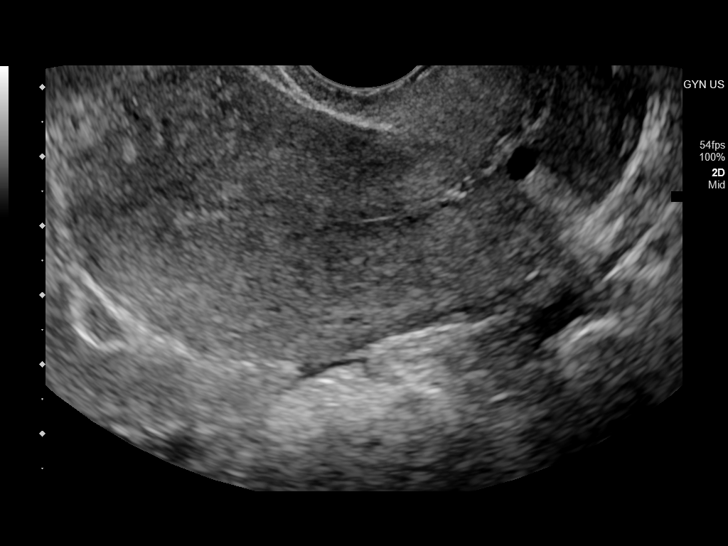
[im 55/132]
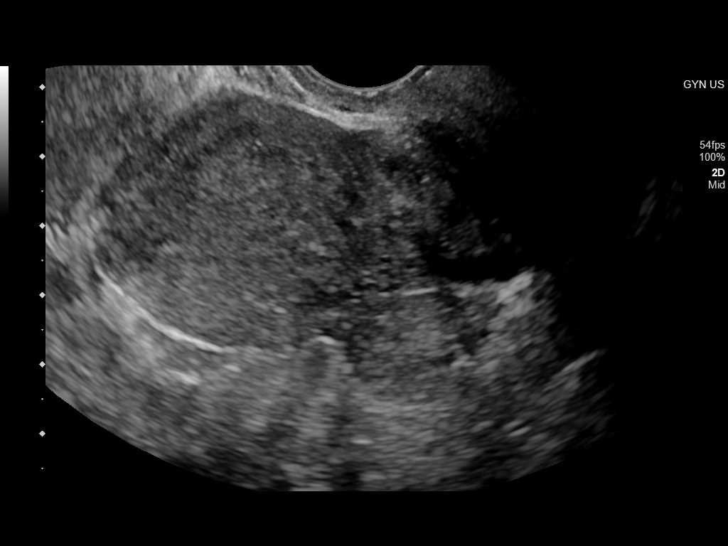
[im 66/132]
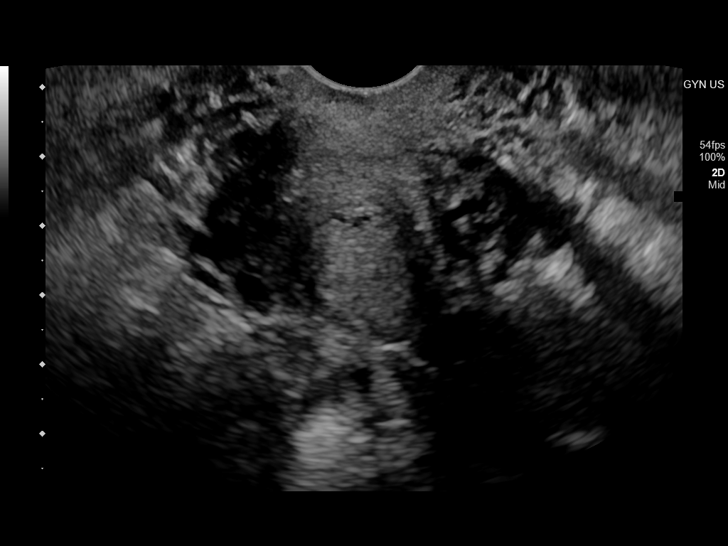
[im 77/132]
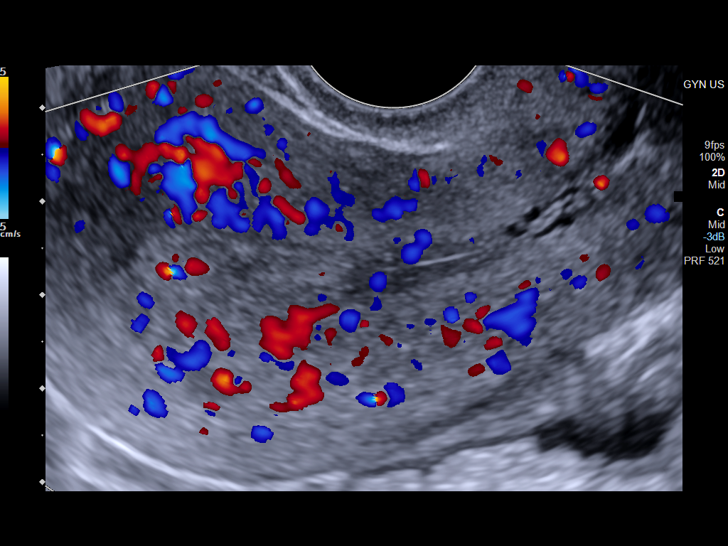
[im 88/132]
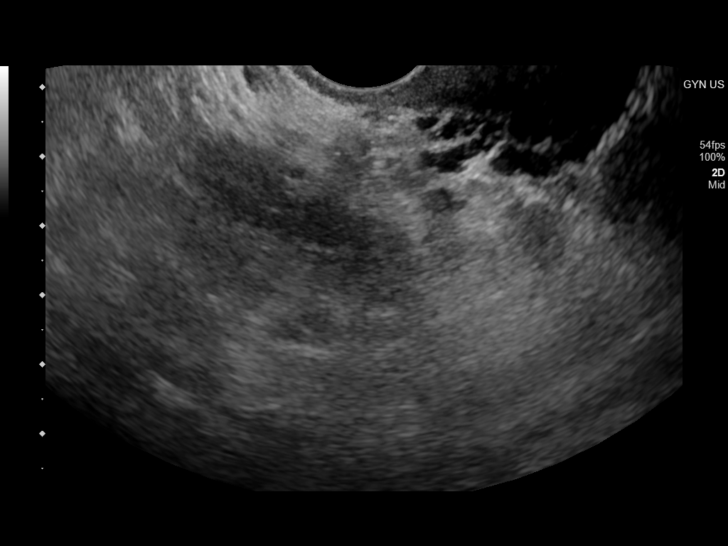
[im 99/132]
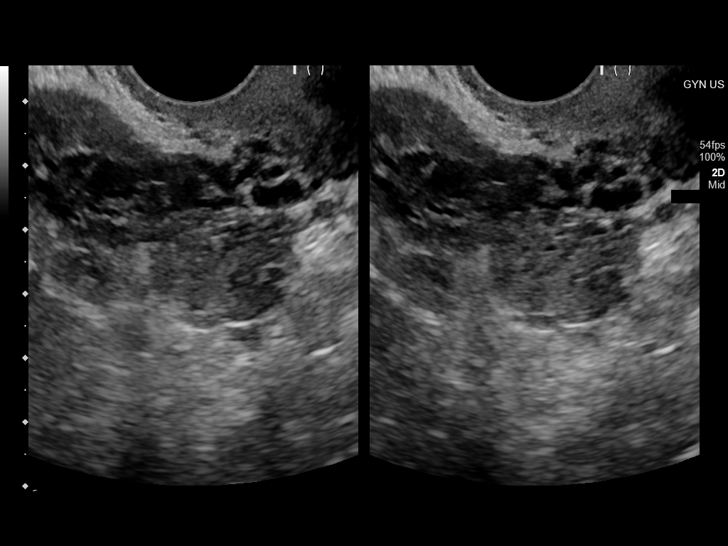
[im 110/132]
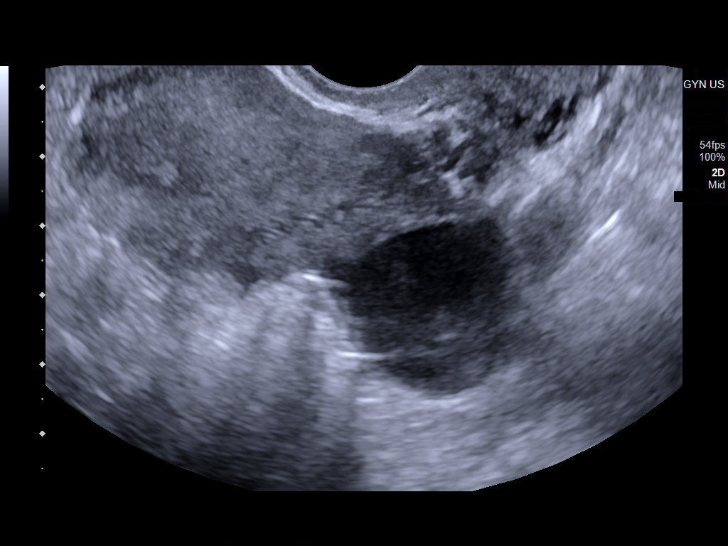
[im 121/132]
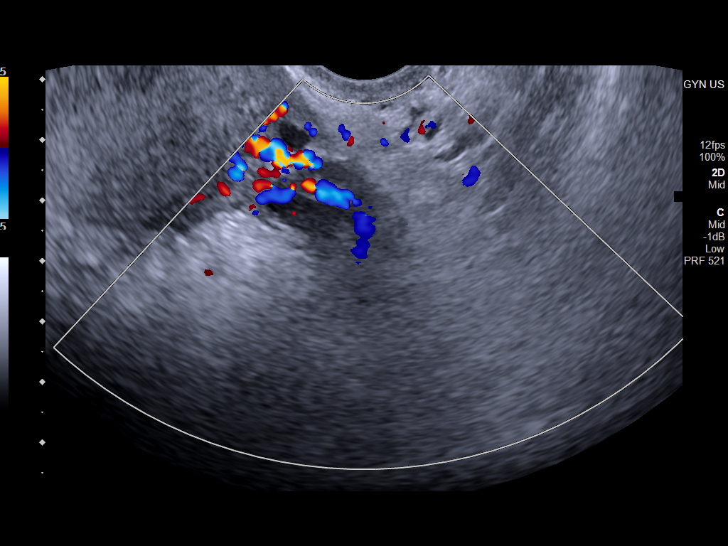
[im 132/132]
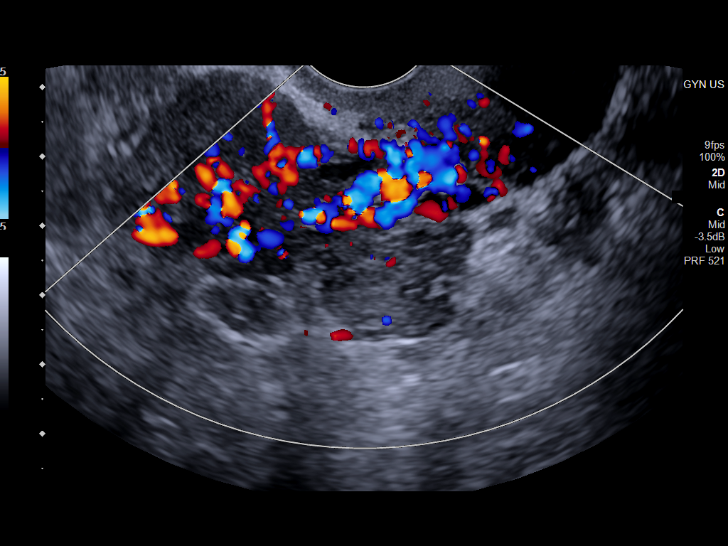

[13 of 25 positions shown; findings below may reference images not displayed]

FINDINGS: Uterus

Measurements: 8.5 x 4.4 x 5.8 cm = volume: 117 mL. A a stable 7 mm
hypoechoic solid mass is seen within the posterior fundus in keeping
with a intramural fibroid. No other intrauterine masses are seen.
The cervix is unremarkable.

Endometrium

Thickness: 7 mm.  No focal abnormality visualized.

Right ovary

Measurements: 2.5 x 2.4 x 1.6 cm = volume: 5 mL. Normal
appearance/no adnexal mass.

Left ovary

Measurements: 3.8 x 2.4 x 3.2 cm = volume: 15 mL. The left ovary
contains a 3.1 x 2.5 x 2.5 cm complex cystic lesion containing
homogeneous internal echoes without internal septation, nodularity,
or vascularity most in keeping with an endometrioma. This is
unchanged from prior examination.

Other findings

No abnormal free fluid.
IMPRESSION: Stable left adnexal complex cystic lesion most in keeping with an
endometrioma.

Small posterior fundal fibroid.

## 2022-10-18 ENCOUNTER — Ambulatory Visit: Payer: Self-pay | Admitting: Physician Assistant

## 2022-10-18 ENCOUNTER — Other Ambulatory Visit: Payer: Self-pay | Admitting: Physician Assistant

## 2022-10-18 ENCOUNTER — Encounter: Payer: Self-pay | Admitting: Physician Assistant

## 2022-10-18 VITALS — BP 129/83 | HR 96 | Temp 98.9°F | Resp 14

## 2022-10-18 DIAGNOSIS — J3489 Other specified disorders of nose and nasal sinuses: Secondary | ICD-10-CM

## 2022-10-18 LAB — POC COVID19 BINAXNOW: SARS Coronavirus 2 Ag: NEGATIVE

## 2022-10-18 MED ORDER — FLUCONAZOLE 150 MG PO TABS: 150.0000 mg | ORAL_TABLET | Freq: Once | ORAL | 0 refills | Status: AC

## 2022-10-18 MED ORDER — FEXOFENADINE-PSEUDOEPHED ER 60-120 MG PO TB12: 1.0000 | ORAL_TABLET | Freq: Two times a day (BID) | ORAL | 0 refills | Status: DC

## 2022-10-18 MED ORDER — AMOXICILLIN 875 MG PO TABS: 875.0000 mg | ORAL_TABLET | Freq: Two times a day (BID) | ORAL | 0 refills | Status: AC

## 2022-10-18 NOTE — Progress Notes (Signed)
   Subjective:Sinus Congestion    Patient ID: Tonya Leonard, female    DOB: 06-08-1975, 47 y.o.   MRN: [id number]  HPI Patient presents with 2 weeks of sinus congestion.  Patient states initial complaint consist of vertigo which lasted about 5 days.  Patient took over-the-counter medication for 3 days.  Denies recent travel or known contact with COVID-19.  States now she is continue having sinus pressure and frontal headaches.  Denies vertigo at this time.  Denies cough.   Review of Systems Negative except for above complaint urine,    Objective:   Physical Exam BP 129/83  Pulse 96  Resp 14  Temp 98.9 F (37.2 C)  SpO2 98 %  No acute distress.  HEENT is remarkable for edematous nasal turbinates.  Bilateral maxillary guarding.  Neck is supple followed lymphadenectomy.  Lungs are clear to auscultation.  Heart regular rate and rhythm.       Assessment & Plan: Subacute maxillary sinusitis  Patient given a prescription for amoxicillin, Allegra-D, and Diflucan.  Patient advised follow-up 1 week if no improvement or worsening complaints.

## 2022-10-18 NOTE — Progress Notes (Signed)
Tested negative for COVID prior to visit.  Stated has sinus pressure and had recent 3 days of vertigo that had her in bed.

## 2022-11-06 DIAGNOSIS — G43109 Migraine with aura, not intractable, without status migrainosus: Secondary | ICD-10-CM | POA: Diagnosis not present

## 2022-11-15 DIAGNOSIS — G43109 Migraine with aura, not intractable, without status migrainosus: Secondary | ICD-10-CM | POA: Diagnosis not present

## 2023-01-21 ENCOUNTER — Ambulatory Visit (INDEPENDENT_AMBULATORY_CARE_PROVIDER_SITE_OTHER): Payer: 59 | Admitting: Dermatology

## 2023-01-21 ENCOUNTER — Encounter: Payer: Self-pay | Admitting: Dermatology

## 2023-01-21 ENCOUNTER — Other Ambulatory Visit: Payer: Self-pay | Admitting: Dermatology

## 2023-01-21 DIAGNOSIS — Z872 Personal history of diseases of the skin and subcutaneous tissue: Secondary | ICD-10-CM | POA: Diagnosis not present

## 2023-01-21 DIAGNOSIS — L578 Other skin changes due to chronic exposure to nonionizing radiation: Secondary | ICD-10-CM | POA: Diagnosis not present

## 2023-01-21 DIAGNOSIS — D039 Melanoma in situ, unspecified: Secondary | ICD-10-CM

## 2023-01-21 DIAGNOSIS — D492 Neoplasm of unspecified behavior of bone, soft tissue, and skin: Secondary | ICD-10-CM | POA: Diagnosis not present

## 2023-01-21 DIAGNOSIS — S90211A Contusion of right great toe with damage to nail, initial encounter: Secondary | ICD-10-CM

## 2023-01-21 DIAGNOSIS — S90221A Contusion of right lesser toe(s) with damage to nail, initial encounter: Secondary | ICD-10-CM

## 2023-01-21 DIAGNOSIS — L719 Rosacea, unspecified: Secondary | ICD-10-CM | POA: Diagnosis not present

## 2023-01-21 DIAGNOSIS — D2372 Other benign neoplasm of skin of left lower limb, including hip: Secondary | ICD-10-CM

## 2023-01-21 DIAGNOSIS — D1801 Hemangioma of skin and subcutaneous tissue: Secondary | ICD-10-CM

## 2023-01-21 DIAGNOSIS — L814 Other melanin hyperpigmentation: Secondary | ICD-10-CM

## 2023-01-21 DIAGNOSIS — W908XXA Exposure to other nonionizing radiation, initial encounter: Secondary | ICD-10-CM | POA: Diagnosis not present

## 2023-01-21 DIAGNOSIS — Z1283 Encounter for screening for malignant neoplasm of skin: Secondary | ICD-10-CM | POA: Diagnosis not present

## 2023-01-21 DIAGNOSIS — Z86018 Personal history of other benign neoplasm: Secondary | ICD-10-CM | POA: Diagnosis not present

## 2023-01-21 DIAGNOSIS — L821 Other seborrheic keratosis: Secondary | ICD-10-CM

## 2023-01-21 DIAGNOSIS — D229 Melanocytic nevi, unspecified: Secondary | ICD-10-CM

## 2023-01-21 DIAGNOSIS — D239 Other benign neoplasm of skin, unspecified: Secondary | ICD-10-CM

## 2023-01-21 DIAGNOSIS — L570 Actinic keratosis: Secondary | ICD-10-CM | POA: Diagnosis not present

## 2023-01-21 DIAGNOSIS — D0371 Melanoma in situ of right lower limb, including hip: Secondary | ICD-10-CM | POA: Diagnosis not present

## 2023-01-21 HISTORY — DX: Melanoma in situ, unspecified: D03.9

## 2023-01-21 MED ORDER — METRONIDAZOLE 0.75 % EX CREA: TOPICAL_CREAM | CUTANEOUS | 1 refills | Status: DC

## 2023-01-21 MED ORDER — DOXYCYCLINE MONOHYDRATE 100 MG PO CAPS: 100.0000 mg | ORAL_CAPSULE | Freq: Two times a day (BID) | ORAL | 1 refills | Status: DC

## 2023-01-21 NOTE — Progress Notes (Addendum)
 Follow-Up Visit   Subjective  Tonya Leonard is a 47 y.o. female who presents for the following: Skin Cancer Screening and Full Body Skin Exam. Hx of dysplastic nevi. Hx of AK.   Recheck nose. Has been treated with LN2 twice. Has used 5FU/Calcipotriene in 2022. States area became red and inflamed during treatment.  Check lesion on right lower leg below knee. Thinks is getting larger.   Has bad scar on left lower leg. Mole removal by Dr. Arlyss at [facility].   The patient presents for Total-Body Skin Exam (TBSE) for skin cancer screening and mole check. The patient has spots, moles and lesions to be evaluated, some may be new or changing and the patient may have concern these could be cancer.    The following portions of the chart were reviewed this encounter and updated as appropriate: medications, allergies, medical history  Review of Systems:  No other skin or systemic complaints except as noted in HPI or Assessment and Plan.  Objective  Well appearing patient in no apparent distress; mood and affect are within normal limits.  A full examination was performed including scalp, head, eyes, ears, nose, lips, neck, chest, axillae, abdomen, back, buttocks, bilateral upper extremities, bilateral lower extremities, hands, feet, fingers, toes, fingernails, and toenails. All findings within normal limits unless otherwise noted below.   Relevant physical exam findings are noted in the Assessment and Plan.  Right upper pretibial 6 mm medium brown macule with notch         central upper chest 4 mm violaceous macule with granular pigmentation       Right Great Toenail See photo             Assessment & Plan   HISTORY OF DYSPLASTIC NEVI No evidence of recurrence today Recommend regular full body skin exams Recommend daily broad spectrum sunscreen SPF 30+ to sun-exposed areas, reapply every 2 hours as needed.  Call if any new or changing lesions are noted  between office visits   SKIN CANCER SCREENING PERFORMED TODAY.  ACTINIC DAMAGE - Chronic condition, secondary to cumulative UV/sun exposure - diffuse scaly erythematous macules with underlying dyspigmentation - Recommend daily broad spectrum sunscreen SPF 30+ to sun-exposed areas, reapply every 2 hours as needed.  - Staying in the shade or wearing long sleeves, sun glasses (UVA+UVB protection) and wide brim hats (4-inch brim around the entire circumference of the hat) are also recommended for sun protection.  - Call for new or changing lesions.  LENTIGINES, SEBORRHEIC KERATOSES, HEMANGIOMAS - Benign normal skin lesions - Benign-appearing - Call for any changes  MELANOCYTIC NEVI - Tan-brown and/or pink-flesh-colored symmetric macules and papules - Benign appearing on exam today - Observation - Call clinic for new or changing moles - Recommend daily use of broad spectrum spf 30+ sunscreen to sun-exposed areas.  - Check nails when remove polish.  ROSACEA vs recurrent AK that has failed cryotherapy and efudex /calcipotriene Exam Mid face erythematous papule at right nasal tip  Chronic and persistent condition with duration or expected duration over one year. Condition is bothersome/symptomatic for patient. Currently flared.   Rosacea is a chronic progressive skin condition usually affecting the face of adults, causing redness and/or acne bumps. It is treatable but not curable. It sometimes affects the eyes (ocular rosacea) as well. It may respond to topical and/or systemic medication and can flare with stress, sun exposure, alcohol, exercise, topical steroids (including hydrocortisone/cortisone 10) and some foods.  Daily application of broad spectrum spf  30+ sunscreen to face is recommended to reduce flares.  Patient denies grittiness of the eyes  Treatment Plan Clinically suggestive of rosacea vs recurrent AK. Offered biopsy vs empiric treatment for rosacea. Patient wants to avoid scar  on nose if possible. Jointly decided on empiric treatment for rosacea.  Start Doxycycline  100 mg bid with food. #60, 1Rf Start Metronidazole  0.75% cream twice daily to nose.  Doxycycline  should be taken with food to prevent nausea. Do not lay down for 30 minutes after taking. Be cautious with sun exposure and use good sun protection while on this medication. Pregnant women should not take this medication.   Recheck in 2 months. Bx if not resolved.    DERMATOFIBROMA Exam: Firm pink/brown papulenodule with dimple sign at right lateral thigh. Treatment Plan: A dermatofibroma is a benign growth possibly related to trauma, such as an insect bite, cut from shaving, or inflamed acne-type bump.  Treatment options to remove include shave or excision with resulting scar and risk of recurrence.  Since benign-appearing and not bothersome, will observe for now.   SCAR Exam: Dyspigmented atrophic macule with recurrence of color at left lateral leg above ankle. Benign-appearing.  Observation.  Call clinic for new or changing lesions. Recommend daily broad spectrum sunscreen SPF 30+, reapply every 2 hours as needed. Treatment: Recommend Serica moisturizing scar formula cream every night or [facility] brand or Mederma silicone scar sheet every night for the first year after a scar appears to help with scar remodeling if desired. Scars remodel on their own for a full year and will gradually improve in appearance over time.    Neoplasm of skin (2) Right upper pretibial  Skin / nail biopsy Type of biopsy: tangential   Informed consent: discussed and consent obtained   Timeout: patient name, date of birth, surgical site, and procedure verified   Procedure prep:  Patient was prepped and draped in usual sterile fashion Prep type:  Isopropyl alcohol Anesthesia: the lesion was anesthetized in a standard fashion   Anesthetic:  1% lidocaine  w/ epinephrine 1-100,000 buffered w/ 8.4% NaHCO3 Instrument used:  DermaBlade   Hemostasis achieved with: pressure and aluminum chloride   Outcome: patient tolerated procedure well   Post-procedure details: sterile dressing applied and wound care instructions given   Dressing type: bandage and petrolatum    Specimen 1 - Surgical pathology Differential Diagnosis: R/O dysplastic nevus vs MM  Check Margins: No  central upper chest  Skin / nail biopsy Type of biopsy: tangential   Informed consent: discussed and consent obtained   Timeout: patient name, date of birth, surgical site, and procedure verified   Procedure prep:  Patient was prepped and draped in usual sterile fashion Prep type:  Isopropyl alcohol Anesthesia: the lesion was anesthetized in a standard fashion   Anesthetic:  1% lidocaine  w/ epinephrine 1-100,000 buffered w/ 8.4% NaHCO3 Instrument used: DermaBlade   Hemostasis achieved with: pressure and aluminum chloride   Outcome: patient tolerated procedure well   Post-procedure details: sterile dressing applied and wound care instructions given   Dressing type: bandage and petrolatum    Specimen 2 - Surgical pathology Differential Diagnosis: Lichenoid keratosis vs BCC  Check Margins: No  Subungual hematoma of toenail of right foot, initial encounter Right Great Toenail  Advised can take 6-9 months for lesion to grow completely out. Monitor for outward progression  Multiple benign nevi  Actinic elastosis  Seborrheic keratoses  Lentigines  Cherry angioma  Rosacea  Dermatofibroma   Return in about 1 year (  around 01/21/2024) for TBSE, HxDN; recheck nose/rosacea in 2 months.SABRA LILLETTE Kate Robin, CMA, am acting as scribe for Boneta Sharps, MD.   Documentation: I have reviewed the above documentation for accuracy and completeness, and I agree with the above.  Boneta Sharps, MD

## 2023-01-21 NOTE — Patient Instructions (Addendum)
Start Doxycycline 100 mg bid with food. #60, 1 Rf. Take as directed until follow up appointment.  Start Metronidazole 0.75% cream twice daily to nose.   Doxycycline should be taken with food to prevent nausea. Do not lay down for 30 minutes after taking. Be cautious with sun exposure and use good sun protection while on this medication. Pregnant women should not take this medication.     Wound Care Instructions  Cleanse wound gently with soap and water once a day then pat dry with clean gauze. Apply a thin coat of Petrolatum (petroleum jelly, "Vaseline") over the wound (unless you have an allergy to this). We recommend that you use a new, sterile tube of Vaseline. Do not pick or remove scabs. Do not remove the yellow or white "healing tissue" from the base of the wound.  Cover the wound with fresh, clean, nonstick gauze and secure with paper tape. You may use Band-Aids in place of gauze and tape if the wound is small enough, but would recommend trimming much of the tape off as there is often too much. Sometimes Band-Aids can irritate the skin.  You should call the office for your biopsy report after 1 week if you have not already been contacted.  If you experience any problems, such as abnormal amounts of bleeding, swelling, significant bruising, significant pain, or evidence of infection, please call the office immediately.  FOR ADULT SURGERY PATIENTS: If you need something for pain relief you may take 1 extra strength Tylenol (acetaminophen) AND 2 Ibuprofen (200mg  each) together every 4 hours as needed for pain. (do not take these if you are allergic to them or if you have a reason you should not take them.) Typically, you may only need pain medication for 1 to 3 days.        Recommend daily broad spectrum sunscreen SPF 30+ to sun-exposed areas, reapply every 2 hours as needed. Call for new or changing lesions.  Staying in the shade or wearing long sleeves, sun glasses (UVA+UVB  protection) and wide brim hats (4-inch brim around the entire circumference of the hat) are also recommended for sun protection.      Melanoma ABCDEs  Melanoma is the most dangerous type of skin cancer, and is the leading cause of death from skin disease.  You are more likely to develop melanoma if you: Have light-colored skin, light-colored eyes, or red or blond hair Spend a lot of time in the sun Tan regularly, either outdoors or in a tanning bed Have had blistering sunburns, especially during childhood Have a close family member who has had a melanoma Have atypical moles or large birthmarks  Early detection of melanoma is key since treatment is typically straightforward and cure rates are extremely high if we catch it early.   The first sign of melanoma is often a change in a mole or a new dark spot.  The ABCDE system is a way of remembering the signs of melanoma.  A for asymmetry:  The two halves do not match. B for border:  The edges of the growth are irregular. C for color:  A mixture of colors are present instead of an even brown color. D for diameter:  Melanomas are usually (but not always) greater than 6mm - the size of a pencil eraser. E for evolution:  The spot keeps changing in size, shape, and color.  Please check your skin once per month between visits. You can use a small mirror in front and a  large mirror behind you to keep an eye on the back side or your body.   If you see any new or changing lesions before your next follow-up, please call to schedule a visit.  Please continue daily skin protection including broad spectrum sunscreen SPF 30+ to sun-exposed areas, reapplying every 2 hours as needed when you're outdoors.   Staying in the shade or wearing long sleeves, sun glasses (UVA+UVB protection) and wide brim hats (4-inch brim around the entire circumference of the hat) are also recommended for sun protection.      Due to recent changes in healthcare laws, you  may see results of your pathology and/or laboratory studies on MyChart before the doctors have had a chance to review them. We understand that in some cases there may be results that are confusing or concerning to you. Please understand that not all results are received at the same time and often the doctors may need to interpret multiple results in order to provide you with the best plan of care or course of treatment. Therefore, we ask that you please give [place] 2 business days to thoroughly review all your results before contacting the office for clarification. Should we see a critical lab result, you will be contacted sooner.   If You Need Anything After Your Visit  If you have any questions or concerns for your doctor, please call our main line at [phone] and press option 4 to reach your doctor's medical assistant. If no one answers, please leave a voicemail as directed and we will return your call as soon as possible. Messages left after 4 pm will be answered the following business day.   You may also send [place] a message via MyChart. We typically respond to MyChart messages within 1-2 business days.  For prescription refills, please ask your pharmacy to contact our office. Our fax number is [phone].  If you have an urgent issue when the clinic is closed that cannot wait until the next business day, you can page your doctor at the number below.    Please note that while we do our best to be available for urgent issues outside of office hours, we are not available 24/7.   If you have an urgent issue and are unable to reach [place], you may choose to seek medical care at your doctor's office, retail clinic, urgent care center, or emergency room.  If you have a medical emergency, please immediately call 911 or go to the emergency department.  Pager Numbers  - Dr. Gwen Pounds: [phone]  - Dr. Roseanne Reno: [phone]  - Dr. Katrinka Blazing: [phone]   In the event of inclement weather, please  call our main line at [phone] for an update on the status of any delays or closures.  Dermatology Medication Tips: Please keep the boxes that topical medications come in in order to help keep track of the instructions about where and how to use these. Pharmacies typically print the medication instructions only on the boxes and not directly on the medication tubes.   If your medication is too expensive, please contact our office at [phone] option 4 or send [place] a message through MyChart.   We are unable to tell what your co-pay for medications will be in advance as this is different depending on your insurance coverage. However, we may be able to find a substitute medication at lower cost or fill out paperwork to get insurance to cover a needed medication.   If a prior authorization is required  to get your medication covered by your insurance company, please allow [place] 1-2 business days to complete this process.  Drug prices often vary depending on where the prescription is filled and some pharmacies may offer cheaper prices.  The website www.goodrx.com contains coupons for medications through different pharmacies. The prices here do not account for what the cost may be with help from insurance (it may be cheaper with your insurance), but the website can give you the price if you did not use any insurance.  - You can print the associated coupon and take it with your prescription to the pharmacy.  - You may also stop by our office during regular business hours and pick up a GoodRx coupon card.  - If you need your prescription sent electronically to a different pharmacy, notify our office through [facility] or by phone at [phone] option 4.     Si Usted Necesita Algo Despus de Su Visita  Tambin puede enviarnos un mensaje a travs de Clinical cytogeneticist. Por lo general respondemos a los mensajes de MyChart en el transcurso de 1 a 2 das hbiles.  Para renovar recetas, por favor pida a  su farmacia que se ponga en contacto con nuestra oficina. Annie Sable de fax es [place] [phone].  Si tiene un asunto urgente cuando la clnica est cerrada y que no puede esperar hasta el siguiente da hbil, puede llamar/localizar a su doctor(a) al nmero que aparece a continuacin.   Por favor, tenga en cuenta que aunque hacemos todo lo posible para estar disponibles para asuntos urgentes fuera del horario de [place], no estamos disponibles las 24 horas del da, los 7 [address]  [address] de la [place].   Si tiene un problema urgente y no puede comunicarse con nosotros, puede optar por buscar atencin mdica  en el consultorio de su doctor(a), en una clnica privada, en un centro de atencin urgente o en una sala de emergencias.  Si tiene Engineer, drilling, por favor llame inmediatamente al 911 o vaya a la sala de emergencias.  Nmeros de bper  - Dr. Gwen Pounds: [phone]  - Dra. Roseanne Reno: [phone]  - Dr. Katrinka Blazing: [phone]   En caso de inclemencias del tiempo, por favor llame a Lacy Duverney principal al [phone] para una actualizacin sobre el [place] de cualquier retraso o cierre.  Consejos para la medicacin en dermatologa: Por favor, guarde las cajas en las que vienen los medicamentos de uso tpico para ayudarle a seguir las instrucciones sobre dnde y cmo usarlos. Las farmacias generalmente imprimen las instrucciones del medicamento slo en las cajas y no directamente en los tubos del [place].   Si su medicamento es muy caro, por favor, pngase en contacto con Rolm [place]ala llamando al [phone] y presione la opcin 4 o envenos un mensaje a travs de Clinical cytogeneticist.   No podemos decirle cul ser su copago por los medicamentos por adelantado ya que esto es diferente dependiendo de la cobertura de su seguro. Sin embargo, es posible que podamos encontrar un medicamento sustituto a Audiological scientist un formulario para que el seguro cubra el medicamento que se considera necesario.    Si se requiere una autorizacin previa para que su compaa de seguros [place] su medicamento, por favor permtanos de 1 a 2 das hbiles para completar [address]  Los precios de los medicamentos varan con frecuencia dependiendo del Environmental consultant de dnde se surte la receta y alguna farmacias pueden ofrecer precios ms baratos.  El sitio web www.goodrx.com tiene cupones para medicamentos de  diferentes farmacias. Los precios aqu no tienen en cuenta lo que podra costar con la ayuda del seguro (puede ser ms barato con su seguro), pero el sitio web puede darle el precio si no utiliz Tourist information centre manager.  - Puede imprimir el cupn correspondiente y llevarlo con su receta a la farmacia.  - Tambin puede pasar por nuestra oficina durante el horario de atencin regular y Education officer, museum una tarjeta de cupones de GoodRx.  - Si necesita que su receta se enve electrnicamente a una farmacia diferente, informe a nuestra oficina a travs de MyChart de [place] o por telfono llamando al [phone] y presione la opcin 4.

## 2023-01-24 ENCOUNTER — Telehealth: Payer: Self-pay

## 2023-01-24 NOTE — Telephone Encounter (Signed)
[facility] left voicemail to report pathology, but their offices were already closed when we checked voicemail.

## 2023-01-25 ENCOUNTER — Encounter: Payer: Self-pay | Admitting: Dermatology

## 2023-01-25 LAB — SURGICAL PATHOLOGY

## 2023-01-25 NOTE — Telephone Encounter (Signed)
Called [facility] back and results will be released today.

## 2023-01-28 ENCOUNTER — Telehealth: Payer: Self-pay | Admitting: Dermatology

## 2023-01-28 ENCOUNTER — Other Ambulatory Visit: Payer: Self-pay

## 2023-01-28 DIAGNOSIS — N6001 Solitary cyst of right breast: Secondary | ICD-10-CM

## 2023-01-28 DIAGNOSIS — D0371 Melanoma in situ of right lower limb, including hip: Secondary | ICD-10-CM

## 2023-01-28 NOTE — Telephone Encounter (Signed)
Dr. Katrinka Blazing discussed pathology results with patient. Recommended Mohs surgery. Referral sent for Dr. Lorn Junes at [facility]. Patient voiced understanding.

## 2023-01-28 NOTE — Progress Notes (Signed)
Telephone call from [facility] requesting orders for follow up imaging studies  Orders placed for Dx Right mammogram and right Breast U/S

## 2023-01-28 NOTE — Telephone Encounter (Signed)
-----   Message from [facility] sent at 01/25/2023  2:13 PM EST ----- Diagnosis: 1. Skin, right upper pretibial :       MALIGNANT MELANOMA IN SITU, PERIPHERAL MARGIN INVOLVED, SEE DESCRIPTION        2. Skin, central upper chest :       LICHENOID ACTINIC KERATOSIS   FOR LEG Please call with diagnosis and determine where the patient would like to have Mohs surgery.  Explanation: The biopsy shows a melanoma skin cancer, which is a cancer of the pigment-producing melanocytes. It has the potential to spread beyond the skin and threaten life, therefore, we recommend surgery for complete removal.  Treatment: Given the location and type of skin cancer, I recommend Mohs surgery. Mohs surgery involves cutting out the skin cancer and then checking under the microscope to ensure the whole skin cancer was removed. If any skin cancer remains, the surgeon will cut out more until it is fully removed. The cure rate is about 98-99%. Once the Mohs surgeon confirms the skin cancer is out, they will discuss the options to repair or heal the area. You must take it easy for about two weeks after surgery (no lifting over 10-15 lbs, avoid activity to get your heart rate and blood pressure up). It is done at another office outside of [place] ([place], [place], or [place]).  FOR CHEST Biopsy shows a benign inflamed keratosis. No further treatment is needed.

## 2023-01-28 NOTE — Telephone Encounter (Signed)
Spoke to patient by phone regarding biopsy results. Chest biopsy shows inflamed benign keratosis; no treatment needed. R leg shows melanoma in situ, cancer of melanocytes, risk of metastasis if not treated, earliest melanoma, high rate of cure with excision. Highest rate of cure with Mohs surgery. Offered skin surgery center vs Dr Caralyn Guile vs Dr Lorn Junes for Mohs. Patient opts for Dr Lorn Junes  Follow up scheduled in January to recheck nose and other dark lesions.

## 2023-01-29 DIAGNOSIS — R928 Other abnormal and inconclusive findings on diagnostic imaging of breast: Secondary | ICD-10-CM | POA: Diagnosis not present

## 2023-03-25 ENCOUNTER — Encounter: Payer: Self-pay | Admitting: Dermatology

## 2023-03-25 ENCOUNTER — Ambulatory Visit (INDEPENDENT_AMBULATORY_CARE_PROVIDER_SITE_OTHER): Payer: 59 | Admitting: Dermatology

## 2023-03-25 DIAGNOSIS — D492 Neoplasm of unspecified behavior of bone, soft tissue, and skin: Secondary | ICD-10-CM

## 2023-03-25 NOTE — Progress Notes (Signed)
   Follow-Up Visit   Subjective  Tonya Leonard is a 48 y.o. female who presents for the following: Rosacea. 2 month follow up. Took Doxycycline twice daily and used Metronidazole cream as directed. Lesion improved. Stopped taking Doxycyline as consistently and lesion recurred. Has restarted treatment regimen consistently. Lesion is still there. Bleeds at times. Has been Tx with LN2 twice and treated with chemotherapy cream.     The following portions of the chart were reviewed this encounter and updated as appropriate: medications, allergies, medical history  Review of Systems:  No other skin or systemic complaints except as noted in HPI or Assessment and Plan.  Objective  Well appearing patient in no apparent distress; mood and affect are within normal limits.  Areas Examined: face  Relevant physical exam findings are noted in the Assessment and Plan.  Nasal tip erythema with telangiectasias, slight scaling, slight hyperkeratosis   Assessment & Plan   NEOPLASM OF SKIN Nasal tip Ddx ROSACEA vs AK vs ISK vs SCC/BCC  Patient asking if Dr. Lorn Junes will do a biopsy during Mohs appointment tomorrow. Advised her Dr. Lorn Junes may biopsy the lesion if he thinks it is suspicious for a skin cancer, but he may not. If he does not, patient will call our office for appointment and will biopsy here. Okay to double book per Dr. Katrinka Blazing.  Stop Doxycycline  Stop Metronidazole     Return in about 1 month (around 04/25/2023) for TBSE, HxMIS.  I, Lawson Radar, CMA, am acting as scribe for Elie Goody, MD.   Documentation: I have reviewed the above documentation for accuracy and completeness, and I agree with the above.  Elie Goody, MD

## 2023-03-25 NOTE — Patient Instructions (Addendum)
Stop Doxycycline 100 mg bid with food. #60, 1Rf Stop Metronidazole 0.75% cream twice daily to nose.  Counseling for BBL / IPL / Laser and Coordination of Care Discussed the treatment option of Broad Band Light (BBL) /Intense Pulsed Light (IPL)/ Laser for skin discoloration, including brown spots and redness.  Typically we recommend at least 1-3 treatment sessions about 5-8 weeks apart for best results.  Cannot have tanned skin when BBL performed, and regular use of sunscreen/photoprotection is advised after the procedure to help maintain results. The patient's condition may also require "maintenance treatments" in the future.  The fee for BBL / laser treatments is $350 per treatment session for the whole face.  A fee can be quoted for other parts of the body.  Insurance typically does not pay for BBL/laser treatments and therefore the fee is an out-of-pocket cost. Recommend prophylactic valtrex treatment. Once scheduled for procedure, will send Rx in prior to patient's appointment.     Recommend daily broad spectrum sunscreen SPF 30+ to sun-exposed areas, reapply every 2 hours as needed. Call for new or changing lesions.  Staying in the shade or wearing long sleeves, sun glasses (UVA+UVB protection) and wide brim hats (4-inch brim around the entire circumference of the hat) are also recommended for sun protection.     Due to recent changes in healthcare laws, you may see results of your pathology and/or laboratory studies on MyChart before the doctors have had a chance to review them. We understand that in some cases there may be results that are confusing or concerning to you. Please understand that not all results are received at the same time and often the doctors may need to interpret multiple results in order to provide you with the best plan of care or course of treatment. Therefore, we ask that you please give [place] 2 business days to thoroughly review all your results before contacting the office  for clarification. Should we see a critical lab result, you will be contacted sooner.   If You Need Anything After Your Visit  If you have any questions or concerns for your doctor, please call our main line at [phone] and press option 4 to reach your doctor's medical assistant. If no one answers, please leave a voicemail as directed and we will return your call as soon as possible. Messages left after 4 pm will be answered the following business day.   You may also send [place] a message via MyChart. We typically respond to MyChart messages within 1-2 business days.  For prescription refills, please ask your pharmacy to contact our office. Our fax number is [phone].  If you have an urgent issue when the clinic is closed that cannot wait until the next business day, you can page your doctor at the number below.    Please note that while we do our best to be available for urgent issues outside of office hours, we are not available 24/7.   If you have an urgent issue and are unable to reach [place], you may choose to seek medical care at your doctor's office, retail clinic, urgent care center, or emergency room.  If you have a medical emergency, please immediately call 911 or go to the emergency department.  Pager Numbers  - Dr. Gwen Pounds: [phone]  - Dr. Roseanne Reno: [phone]  - Dr. Katrinka Blazing: [phone]   In the event of inclement weather, please call our main line at [phone] for an update on the status of any delays or closures.  Dermatology Medication Tips: Please keep the boxes that topical medications come in in order to help keep track of the instructions about where and how to use these. Pharmacies typically print the medication instructions only on the boxes and not directly on the medication tubes.   If your medication is too expensive, please contact our office at [phone] option 4 or send [place] a message through MyChart.   We are unable to tell what your co-pay for  medications will be in advance as this is different depending on your insurance coverage. However, we may be able to find a substitute medication at lower cost or fill out paperwork to get insurance to cover a needed medication.   If a prior authorization is required to get your medication covered by your insurance company, please allow [place] 1-2 business days to complete this process.  Drug prices often vary depending on where the prescription is filled and some pharmacies may offer cheaper prices.  The website www.goodrx.com contains coupons for medications through different pharmacies. The prices here do not account for what the cost may be with help from insurance (it may be cheaper with your insurance), but the website can give you the price if you did not use any insurance.  - You can print the associated coupon and take it with your prescription to the pharmacy.  - You may also stop by our office during regular business hours and pick up a GoodRx coupon card.  - If you need your prescription sent electronically to a different pharmacy, notify our office through [facility] or by phone at [phone] option 4.     Si Usted Necesita Algo Despus de Su Visita  Tambin puede enviarnos un mensaje a travs de Clinical cytogeneticist. Por lo general respondemos a los mensajes de MyChart en el transcurso de 1 a 2 das hbiles.  Para renovar recetas, por favor pida a su farmacia que se ponga en contacto con nuestra oficina. Annie Sable de fax es [place] [phone].  Si tiene un asunto urgente cuando la clnica est cerrada y que no puede esperar hasta el siguiente da hbil, puede llamar/localizar a su doctor(a) al nmero que aparece a continuacin.   Por favor, tenga en cuenta que aunque hacemos todo lo posible para estar disponibles para asuntos urgentes fuera del horario de [place], no estamos disponibles las 24 horas del da, los 7 [address]  [address] de la [place].   Si tiene un problema urgente y no puede  comunicarse con nosotros, puede optar por buscar atencin mdica  en el consultorio de su doctor(a), en una clnica privada, en un centro de atencin urgente o en una sala de emergencias.  Si tiene Engineer, drilling, por favor llame inmediatamente al 911 o vaya a la sala de emergencias.  Nmeros de bper  - Dr. Gwen Pounds: [phone]  - Dra. Roseanne Reno: [phone]  - Dr. Katrinka Blazing: [phone]   En caso de inclemencias del tiempo, por favor llame a Lacy Duverney principal al [phone] para una actualizacin sobre el [place] de cualquier retraso o cierre.  Consejos para la medicacin en dermatologa: Por favor, guarde las cajas en las que vienen los medicamentos de uso tpico para ayudarle a seguir las instrucciones sobre dnde y cmo usarlos. Las farmacias generalmente imprimen las instrucciones del medicamento slo en las cajas y no directamente en los tubos del [place].   Si su medicamento es muy caro, por favor, pngase en contacto con Rolm Gala llamando al [phone] y presione la opcin 4 o envenos  un mensaje a travs de MyChart.   No podemos decirle cul ser su copago por los medicamentos por adelantado ya que esto es diferente dependiendo de la cobertura de su seguro. Sin embargo, es posible que podamos encontrar un medicamento sustituto a Audiological scientist un formulario para que el seguro cubra el medicamento que se considera necesario.   Si se requiere una autorizacin previa para que su compaa de seguros [place] su medicamento, por favor permtanos de 1 a 2 das hbiles para completar [address]  Los precios de los medicamentos varan con frecuencia dependiendo del Environmental consultant de dnde se surte la receta y alguna farmacias pueden ofrecer precios ms baratos.  El sitio web www.goodrx.com tiene cupones para medicamentos de Health and safety inspector. Los precios aqu no tienen en cuenta lo que podra costar con la ayuda del seguro (puede ser ms barato con su seguro), pero  el sitio web puede darle el precio si no utiliz Tourist information centre manager.  - Puede imprimir el cupn correspondiente y llevarlo con su receta a la farmacia.  - Tambin puede pasar por nuestra oficina durante el horario de atencin regular y Education officer, museum una tarjeta de cupones de GoodRx.  - Si necesita que su receta se enve electrnicamente a una farmacia diferente, informe a nuestra oficina a travs de MyChart de [place] o por telfono llamando al [phone] y presione la opcin 4.

## 2023-04-30 ENCOUNTER — Ambulatory Visit: Payer: 59 | Admitting: Dermatology

## 2023-07-11 ENCOUNTER — Encounter: Payer: Self-pay | Admitting: Dermatology

## 2023-07-11 ENCOUNTER — Ambulatory Visit (INDEPENDENT_AMBULATORY_CARE_PROVIDER_SITE_OTHER): Admitting: Dermatology

## 2023-07-11 DIAGNOSIS — C44311 Basal cell carcinoma of skin of nose: Secondary | ICD-10-CM | POA: Diagnosis not present

## 2023-07-11 DIAGNOSIS — D2371 Other benign neoplasm of skin of right lower limb, including hip: Secondary | ICD-10-CM

## 2023-07-11 DIAGNOSIS — D492 Neoplasm of unspecified behavior of bone, soft tissue, and skin: Secondary | ICD-10-CM | POA: Diagnosis not present

## 2023-07-11 DIAGNOSIS — D229 Melanocytic nevi, unspecified: Secondary | ICD-10-CM

## 2023-07-11 DIAGNOSIS — Z1283 Encounter for screening for malignant neoplasm of skin: Secondary | ICD-10-CM

## 2023-07-11 DIAGNOSIS — L578 Other skin changes due to chronic exposure to nonionizing radiation: Secondary | ICD-10-CM

## 2023-07-11 DIAGNOSIS — Q828 Other specified congenital malformations of skin: Secondary | ICD-10-CM

## 2023-07-11 DIAGNOSIS — D1801 Hemangioma of skin and subcutaneous tissue: Secondary | ICD-10-CM

## 2023-07-11 DIAGNOSIS — L570 Actinic keratosis: Secondary | ICD-10-CM | POA: Diagnosis not present

## 2023-07-11 DIAGNOSIS — L821 Other seborrheic keratosis: Secondary | ICD-10-CM

## 2023-07-11 DIAGNOSIS — L814 Other melanin hyperpigmentation: Secondary | ICD-10-CM | POA: Diagnosis not present

## 2023-07-11 DIAGNOSIS — D485 Neoplasm of uncertain behavior of skin: Secondary | ICD-10-CM

## 2023-07-11 DIAGNOSIS — W908XXA Exposure to other nonionizing radiation, initial encounter: Secondary | ICD-10-CM | POA: Diagnosis not present

## 2023-07-11 DIAGNOSIS — C4491 Basal cell carcinoma of skin, unspecified: Secondary | ICD-10-CM

## 2023-07-11 DIAGNOSIS — D499 Neoplasm of unspecified behavior of unspecified site: Secondary | ICD-10-CM | POA: Diagnosis not present

## 2023-07-11 DIAGNOSIS — D239 Other benign neoplasm of skin, unspecified: Secondary | ICD-10-CM

## 2023-07-11 HISTORY — DX: Neoplasm of uncertain behavior of skin: D48.5

## 2023-07-11 HISTORY — DX: Basal cell carcinoma of skin, unspecified: C44.91

## 2023-07-11 NOTE — Progress Notes (Signed)
 Follow-Up Visit   Subjective  Tonya Leonard is a 48 y.o. female who presents for the following: Skin Cancer Screening and Full Body Skin Exam. Hx of MIS. Right upper pretibial. Hx of dysplastic nevi. Hx of AK.   Biopsy lesion at nasal tip. Has been Tx with LN2 twice and treated with chemotherapy cream, metronidazole  cream and has taken Doxycycline . Lesion will not resolve.   The patient presents for Total-Body Skin Exam (TBSE) for skin cancer screening and mole check. The patient has spots, moles and lesions to be evaluated, some may be new or changing and the patient may have concern these could be cancer.    The following portions of the chart were reviewed this encounter and updated as appropriate: medications, allergies, medical history  Review of Systems:  No other skin or systemic complaints except as noted in HPI or Assessment and Plan.  Objective  Well appearing patient in no apparent distress; mood and affect are within normal limits.  A full examination was performed including scalp, head, eyes, ears, nose, lips, neck, chest, axillae, abdomen, back, buttocks, bilateral upper extremities, bilateral lower extremities, hands, feet, fingers, toes, fingernails, and toenails. All findings within normal limits unless otherwise noted below.   Relevant physical exam findings are noted in the Assessment and Plan.  Exam of nails limited by presence of nail polish.   Nasal Tip erythema with telangiectasias, slight scaling, slight hyperkeratosis, pustule  Right Lower Mid Anterior Leg 4 mm irregular brown macule  upper mid chest x1 Erythematous thin papules/macules with gritty scale.   Assessment & Plan   SKIN CANCER SCREENING PERFORMED TODAY.  HISTORY OF MELANOMA IN SITU - No evidence of recurrence today - No lymphadenopathy - Recommend regular full body skin exams - Recommend daily broad spectrum sunscreen SPF 30+ to sun-exposed areas, reapply every 2 hours as needed.  -  Call if any new or changing lesions are noted between office visits   HISTORY OF DYSPLASTIC NEVI No evidence of recurrence today Recommend regular full body skin exams Recommend daily broad spectrum sunscreen SPF 30+ to sun-exposed areas, reapply every 2 hours as needed.  Call if any new or changing lesions are noted between office visits   ACTINIC DAMAGE - Chronic condition, secondary to cumulative UV/sun exposure - diffuse scaly erythematous macules with underlying dyspigmentation - Recommend daily broad spectrum sunscreen SPF 30+ to sun-exposed areas, reapply every 2 hours as needed.  - Staying in the shade or wearing long sleeves, sun glasses (UVA+UVB protection) and wide brim hats (4-inch brim around the entire circumference of the hat) are also recommended for sun protection.  - Call for new or changing lesions.  LENTIGINES, SEBORRHEIC KERATOSES, HEMANGIOMAS - Benign normal skin lesions - Benign-appearing - Call for any changes  MELANOCYTIC NEVI - Tan-brown and/or pink-flesh-colored symmetric macules and papules - Benign appearing on exam today - Observation - Call clinic for new or changing moles - Recommend daily use of broad spectrum spf 30+ sunscreen to sun-exposed areas.  - Check nails when remove polish.   Porokeratosis  Exam: pink-tan patch with raised, hyperkeratotic border at left lower leg   Treatment:  The patient will observe these symptoms, and report promptly any worsening or unexpected persistence.    DERMATOFIBROMA Exam: Firm pink/brown papulenodule with dimple sign at right lateral thigh. Treatment Plan: A dermatofibroma is a benign growth possibly related to trauma, such as an insect bite, cut from shaving, or inflamed acne-type bump.  Treatment options to remove include  shave or excision with resulting scar and risk of recurrence.  Since benign-appearing and not bothersome, will observe for now.   NEOPLASM OF SKIN (2) Nasal Tip Skin / nail  biopsy Type of biopsy: tangential   Informed consent: discussed and consent obtained   Timeout: patient name, date of birth, surgical site, and procedure verified   Procedure prep:  Patient was prepped and draped in usual sterile fashion Prep type:  Isopropyl alcohol Anesthesia: the lesion was anesthetized in a standard fashion   Anesthetic:  1% lidocaine  w/ epinephrine 1-100,000 buffered w/ 8.4% NaHCO3 Instrument used: DermaBlade   Hemostasis achieved with: pressure and aluminum chloride   Outcome: patient tolerated procedure well   Post-procedure details: sterile dressing applied and wound care instructions given   Dressing type: bandage and petrolatum   Specimen 1 - Surgical pathology Differential Diagnosis: rosacea vs AK vs SCC vs BCC vs hydroa vacciniforme  Check Margins: yes if malignant  Small specimen in bottle  Right Lower Mid Anterior Leg Epidermal / dermal shaving  Lesion diameter (cm):  0.4 Informed consent: discussed and consent obtained   Timeout: patient name, date of birth, surgical site, and procedure verified   Procedure prep:  Patient was prepped and draped in usual sterile fashion Prep type:  Povidone-iodine and isopropyl alcohol Anesthesia: the lesion was anesthetized in a standard fashion   Anesthetic:  1% lidocaine  w/ epinephrine 1-100,000 buffered w/ 8.4% NaHCO3 Instrument used: DermaBlade   Hemostasis achieved with: pressure and aluminum chloride   Outcome: patient tolerated procedure well   Post-procedure details: wound care instructions given   Specimen 2 - Surgical pathology Differential Diagnosis: dysplastic nevus vs melanoma  Check Margins: Yes  Hx of melanoma in situ Nasal tip biopsy was superficial to minimize scar and maximize wound healing. Will do larger biopsy if it is non-diagnostic AK (ACTINIC KERATOSIS) upper mid chest x1 Biopsy proven lichenoid AK, residual  Actinic keratoses are precancerous spots that appear secondary to cumulative  UV radiation exposure/sun exposure over time. They are chronic with expected duration over 1 year. A portion of actinic keratoses will progress to squamous cell carcinoma of the skin. It is not possible to reliably predict which spots will progress to skin cancer and so treatment is recommended to prevent development of skin cancer.  Recommend daily broad spectrum sunscreen SPF 30+ to sun-exposed areas, reapply every 2 hours as needed.  Recommend staying in the shade or wearing long sleeves, sun glasses (UVA+UVB protection) and wide brim hats (4-inch brim around the entire circumference of the hat). Call for new or changing lesions. Destruction of lesion - upper mid chest x1 Complexity: simple   Destruction method: cryotherapy   Informed consent: discussed and consent obtained   Timeout:  patient name, date of birth, surgical site, and procedure verified Lesion destroyed using liquid nitrogen: Yes   Region frozen until ice ball extended beyond lesion: Yes   Cryo cycles: 1 or 2. Outcome: patient tolerated procedure well with no complications   Post-procedure details: wound care instructions given   Additional details:  Prior to procedure, discussed risks of blister formation, small wound, skin dyspigmentation, or rare scar following cryotherapy. Recommend Vaseline ointment to treated areas while healing.  MULTIPLE BENIGN NEVI   LENTIGINES   ACTINIC ELASTOSIS   SEBORRHEIC KERATOSES   CHERRY ANGIOMA   DERMATOFIBROMA   POROKERATOSIS   Return in about 3 months (around 10/11/2023) for TBSE, HxMIS, HxDN, HxAK.  I, Darcie Easterly, CMA, am acting as scribe for [employer]  Felipe Horton, MD.   Documentation: I have reviewed the above documentation for accuracy and completeness, and I agree with the above.  Harris Liming, MD

## 2023-07-11 NOTE — Patient Instructions (Addendum)
Wound Care Instructions  Cleanse wound gently with soap and water once a day then pat dry with clean gauze. Apply a thin coat of Petrolatum (petroleum jelly, "Vaseline") over the wound (unless you have an allergy to this). We recommend that you use a new, sterile tube of Vaseline. Do not pick or remove scabs. Do not remove the yellow or white "healing tissue" from the base of the wound.  Cover the wound with fresh, clean, nonstick gauze and secure with paper tape. You may use Band-Aids in place of gauze and tape if the wound is small enough, but would recommend trimming much of the tape off as there is often too much. Sometimes Band-Aids can irritate the skin.  You should call the office for your biopsy report after 1 week if you have not already been contacted.  If you experience any problems, such as abnormal amounts of bleeding, swelling, significant bruising, significant pain, or evidence of infection, please call the office immediately.  FOR ADULT SURGERY PATIENTS: If you need something for pain relief you may take 1 extra strength Tylenol (acetaminophen) AND 2 Ibuprofen (200mg  each) together every 4 hours as needed for pain. (do not take these if you are allergic to them or if you have a reason you should not take them.) Typically, you may only need pain medication for 1 to 3 days.    Cryotherapy Aftercare  Wash gently with soap and water everyday.   Apply Vaseline and Band-Aid daily until healed.    Recommend daily broad spectrum sunscreen SPF 30+ to sun-exposed areas, reapply every 2 hours as needed. Call for new or changing lesions.  Staying in the shade or wearing long sleeves, sun glasses (UVA+UVB protection) and wide brim hats (4-inch brim around the entire circumference of the hat) are also recommended for sun protection.    Melanoma ABCDEs  Melanoma is the most dangerous type of skin cancer, and is the leading cause of death from skin disease.  You are more likely to develop  melanoma if you: Have light-colored skin, light-colored eyes, or red or blond hair Spend a lot of time in the sun Tan regularly, either outdoors or in a tanning bed Have had blistering sunburns, especially during childhood Have a close family member who has had a melanoma Have atypical moles or large birthmarks  Early detection of melanoma is key since treatment is typically straightforward and cure rates are extremely high if we catch it early.   The first sign of melanoma is often a change in a mole or a new dark spot.  The ABCDE system is a way of remembering the signs of melanoma.  A for asymmetry:  The two halves do not match. B for border:  The edges of the growth are irregular. C for color:  A mixture of colors are present instead of an even brown color. D for diameter:  Melanomas are usually (but not always) greater than 6mm - the size of a pencil eraser. E for evolution:  The spot keeps changing in size, shape, and color.  Please check your skin once per month between visits. You can use a small mirror in front and a large mirror behind you to keep an eye on the back side or your body.   If you see any new or changing lesions before your next follow-up, please call to schedule a visit.  Please continue daily skin protection including broad spectrum sunscreen SPF 30+ to sun-exposed areas, reapplying every 2 hours as  needed when you're outdoors.   Staying in the shade or wearing long sleeves, sun glasses (UVA+UVB protection) and wide brim hats (4-inch brim around the entire circumference of the hat) are also recommended for sun protection.    Due to recent changes in healthcare laws, you may see results of your pathology and/or laboratory studies on MyChart before the doctors have had a chance to review them. We understand that in some cases there may be results that are confusing or concerning to you. Please understand that not all results are received at the same time and often the  doctors may need to interpret multiple results in order to provide you with the best plan of care or course of treatment. Therefore, we ask that you please give [place] 2 business days to thoroughly review all your results before contacting the office for clarification. Should we see a critical lab result, you will be contacted sooner.   If You Need Anything After Your Visit  If you have any questions or concerns for your doctor, please call our main line at [phone] and press option 4 to reach your doctor's medical assistant. If no one answers, please leave a voicemail as directed and we will return your call as soon as possible. Messages left after 4 pm will be answered the following business day.   You may also send [place] a message via MyChart. We typically respond to MyChart messages within 1-2 business days.  For prescription refills, please ask your pharmacy to contact our office. Our fax number is [phone].  If you have an urgent issue when the clinic is closed that cannot wait until the next business day, you can page your doctor at the number below.    Please note that while we do our best to be available for urgent issues outside of office hours, we are not available 24/7.   If you have an urgent issue and are unable to reach [place], you may choose to seek medical care at your doctor's office, retail clinic, urgent care center, or emergency room.  If you have a medical emergency, please immediately call 911 or go to the emergency department.  Pager Numbers  - Dr. Gwen Pounds: [phone]  - Dr. Roseanne Reno: [phone]  - Dr. Katrinka Blazing: [phone]   In the event of inclement weather, please call our main line at [phone] for an update on the status of any delays or closures.  Dermatology Medication Tips: Please keep the boxes that topical medications come in in order to help keep track of the instructions about where and how to use these. Pharmacies typically print the medication  instructions only on the boxes and not directly on the medication tubes.   If your medication is too expensive, please contact our office at [phone] option 4 or send [place] a message through MyChart.   We are unable to tell what your co-pay for medications will be in advance as this is different depending on your insurance coverage. However, we may be able to find a substitute medication at lower cost or fill out paperwork to get insurance to cover a needed medication.   If a prior authorization is required to get your medication covered by your insurance company, please allow [place] 1-2 business days to complete this process.  Drug prices often vary depending on where the prescription is filled and some pharmacies may offer cheaper prices.  The website www.goodrx.com contains coupons for medications through different pharmacies. The prices here do not account for what  the cost may be with help from insurance (it may be cheaper with your insurance), but the website can give you the price if you did not use any insurance.  - You can print the associated coupon and take it with your prescription to the pharmacy.  - You may also stop by our office during regular business hours and pick up a GoodRx coupon card.  - If you need your prescription sent electronically to a different pharmacy, notify our office through [facility] or by phone at [phone] option 4.     Si Usted Necesita Algo Despus de Su Visita  Tambin puede enviarnos un mensaje a travs de Clinical cytogeneticist. Por lo general respondemos a los mensajes de MyChart en el transcurso de 1 a 2 das hbiles.  Para renovar recetas, por favor pida a su farmacia que se ponga en contacto con nuestra oficina. Annie Sable de fax es [place] [phone].  Si tiene un asunto urgente cuando la clnica est cerrada y que no puede esperar hasta el siguiente da hbil, puede llamar/localizar a su doctor(a) al nmero que aparece a continuacin.   Por  favor, tenga en cuenta que aunque hacemos todo lo posible para estar disponibles para asuntos urgentes fuera del horario de [place], no estamos disponibles las 24 horas del da, los 7 [address]  [address] de la [place].   Si tiene un problema urgente y no puede comunicarse con nosotros, puede optar por buscar atencin mdica  en el consultorio de su doctor(a), en una clnica privada, en un centro de atencin urgente o en una sala de emergencias.  Si tiene Engineer, drilling, por favor llame inmediatamente al 911 o vaya a la sala de emergencias.  Nmeros de bper  - Dr. Gwen Pounds: [phone]  - Dra. Roseanne Reno: [phone]  - Dr. Katrinka Blazing: [phone]   En caso de inclemencias del tiempo, por favor llame a Lacy Duverney principal al [phone] para una actualizacin sobre el [place] de cualquier retraso o cierre.  Consejos para la medicacin en dermatologa: Por favor, guarde las cajas en las que vienen los medicamentos de uso tpico para ayudarle a seguir las instrucciones sobre dnde y cmo usarlos. Las farmacias generalmente imprimen las instrucciones del medicamento slo en las cajas y no directamente en los tubos del [place].   Si su medicamento es muy caro, por favor, pngase en contacto con Rolm Gala llamando al [phone] y presione la opcin 4 o envenos un mensaje a travs de Clinical cytogeneticist.   No podemos decirle cul ser su copago por los medicamentos por adelantado ya que esto es diferente dependiendo de la cobertura de su seguro. Sin embargo, es posible que podamos encontrar un medicamento sustituto a Audiological scientist un formulario para que el seguro cubra el medicamento que se considera necesario.   Si se requiere una autorizacin previa para que su compaa de seguros [place] su medicamento, por favor permtanos de 1 a 2 das hbiles para completar [address]  Los precios de los medicamentos varan con frecuencia dependiendo del Environmental consultant de dnde se surte la receta y alguna farmacias  pueden ofrecer precios ms baratos.  El sitio web www.goodrx.com tiene cupones para medicamentos de Health and safety inspector. Los precios aqu no tienen en cuenta lo que podra costar con la ayuda del seguro (puede ser ms barato con su seguro), pero el sitio web puede darle el precio si no utiliz Tourist information centre manager.  - Puede imprimir el cupn correspondiente y llevarlo con su receta a la farmacia.  - Tambin puede  pasar por nuestra oficina durante el horario de atencin regular y Education officer, museum una tarjeta de cupones de GoodRx.  - Si necesita que su receta se enve electrnicamente a una farmacia diferente, informe a nuestra oficina a travs de MyChart de  o por telfono llamando al [phone] y presione la opcin 4.

## 2023-07-17 ENCOUNTER — Ambulatory Visit: Payer: Self-pay | Admitting: Dermatology

## 2023-07-17 DIAGNOSIS — D485 Neoplasm of uncertain behavior of skin: Secondary | ICD-10-CM

## 2023-07-17 DIAGNOSIS — C44311 Basal cell carcinoma of skin of nose: Secondary | ICD-10-CM

## 2023-07-17 LAB — SURGICAL PATHOLOGY

## 2023-07-18 NOTE — Telephone Encounter (Signed)
 Patient called for biopsy results she states she could see her results last night but today she can't see them on My chart, she would like some one to call her today,  I discussed with patient Dr Felipe Horton is in clinic this afternoon, I will send him a message to please call patient after clinic today

## 2023-07-18 NOTE — Telephone Encounter (Signed)
-----   Message from [facility] sent at 07/17/2023  6:22 PM EDT ----- Diagnosis: 1. Skin, nasal tip :       SUPERFICIAL BASAL CELL CARCINOMA        2. Skin, right lower mid anterior leg :       ATYPICAL INTRAEPIDERMAL MELANOCYTIC PROLIFERATION    Please call to share diagnosis and refer to Dr Robert Chimes for Mohs (Nasal tip) and Mohs vs excision (R leg)  NASAL TIP Explanation: your biopsy shows a basal cell skin cancer limited to the top layer of skin. This is the most common kind of skin cancer and is caused by damage from sun exposure. Basal cell skin cancers almost never spread beyond the skin, so they are not dangerous to your overall health. However, they will continue to grow, can bleed, cause nonhealing wounds, and disrupt nearby structures unless fully treated.  Treatment: Given the location and type of skin cancer, I recommend Mohs surgery. Mohs surgery involves cutting out the skin cancer and then checking under the microscope to ensure the whole skin cancer was removed. If any skin cancer remains, the surgeon will cut out more until it is fully removed. The cure rate is about 98-99%. Once the Mohs surgeon confirms the skin cancer is out, they will discuss the options to repair or heal the area. You must take it easy for about two weeks after surgery (no lifting over 10-15 lbs, avoid activity to get your heart rate and blood pressure up). It is done at another office outside of [place] ([place], [place], or [place]).  RIGHT LOWER LEG Biopsy shows an abnormal growth of melanocytes. The pathologist could not fully classify it, but could not exclude a melanoma in situ. We recommend surgery and will refer you to Dr Robert Chimes. He will either do Mohs or a standard excision.

## 2023-07-18 NOTE — Telephone Encounter (Signed)
 Spoke to patient by phone and shared biopsy results. 1. sBCC imiquimod (no surgery no scar no margin check, scouting biopsy at best) vs Mohs (check margins, may be significant surgical repair). Patient wonders whether plastic surgery is an option. Jointly decided to consult Dr Robert Chimes on best management  2. Recommend excision with Dr Robert Chimes given cannot rule out Melanoma in situ  All questions answered

## 2023-07-22 NOTE — Addendum Note (Signed)
 Addended by: Darcie Easterly on: 07/22/2023 10:31 AM   Modules accepted: Orders

## 2023-07-24 ENCOUNTER — Other Ambulatory Visit: Payer: Self-pay

## 2023-07-24 DIAGNOSIS — D485 Neoplasm of uncertain behavior of skin: Secondary | ICD-10-CM

## 2023-07-24 DIAGNOSIS — C44311 Basal cell carcinoma of skin of nose: Secondary | ICD-10-CM

## 2023-07-24 NOTE — Progress Notes (Signed)
 Patient's insurance requires her to go to [facility] for Mohs. Referral sent to Dr. Debrah Fan. Patient advised of change in referral.

## 2023-07-31 ENCOUNTER — Ambulatory Visit: Admitting: Family Medicine

## 2023-07-31 ENCOUNTER — Other Ambulatory Visit (HOSPITAL_COMMUNITY)
Admission: RE | Admit: 2023-07-31 | Discharge: 2023-07-31 | Disposition: A | Discharge status: Home / Self Care | Source: Ambulatory Visit | Attending: Family Medicine | Admitting: Family Medicine

## 2023-07-31 ENCOUNTER — Encounter: Payer: Self-pay | Admitting: Family Medicine

## 2023-07-31 VITALS — BP 129/78 | HR 87 | Ht 71.0 in | Wt 168.4 lb

## 2023-07-31 DIAGNOSIS — N951 Menopausal and female climacteric states: Secondary | ICD-10-CM | POA: Diagnosis not present

## 2023-07-31 DIAGNOSIS — Z124 Encounter for screening for malignant neoplasm of cervix: Secondary | ICD-10-CM

## 2023-07-31 DIAGNOSIS — Z01419 Encounter for gynecological examination (general) (routine) without abnormal findings: Secondary | ICD-10-CM | POA: Diagnosis not present

## 2023-07-31 DIAGNOSIS — Z0000 Encounter for general adult medical examination without abnormal findings: Secondary | ICD-10-CM | POA: Diagnosis not present

## 2023-07-31 DIAGNOSIS — C44311 Basal cell carcinoma of skin of nose: Secondary | ICD-10-CM | POA: Insufficient documentation

## 2023-07-31 DIAGNOSIS — C439 Malignant melanoma of skin, unspecified: Secondary | ICD-10-CM | POA: Insufficient documentation

## 2023-07-31 NOTE — Progress Notes (Signed)
 Patient presents for Annual.  LMP: No LMP recorded.  Last pap: Date: 2024-WNL Contraception: Husband had vasectomy  Mammogram: Up to date: 01/2023 STD Screening: Declines Flu Vaccine : N/A  CC: Annual   How to check cortisol levels and wanting premenopausal levels   Went 5-6 months without period, now few months without period.

## 2023-07-31 NOTE — Progress Notes (Signed)
 Subjective:     Tonya Leonard is a 48 y.o. female and is here for a comprehensive physical exam. The patient reports problems - menopause. Menopause is a beast. Bloating by after dinner. Notes this is quite uncomfortable. Has tried probiotics. Switched diet and cannot lose any weight. Night sweats and hot flashes. Inability to sleep. 5.5-6 months without a cycle. Cycle in July and maybe a few more since then. They are quite irregular. No heavy bleeding or cramping. Has significant brain fog.  The following portions of the patient's history were reviewed and updated as appropriate: allergies, current medications, past family history, past medical history, past social history, past surgical history, and problem list.  Review of Systems Pertinent items noted in HPI and remainder of comprehensive ROS otherwise negative.   Objective:  Chaperone present for exam   BP 129/78   Pulse 87   Ht 5\' 11"  (1.803 m)   Wt 168 lb 6.4 oz (76.4 kg)   BMI 23.49 kg/m  General appearance: alert, cooperative, and appears stated age Head: Normocephalic, without obvious abnormality, atraumatic Neck: no adenopathy, no JVD, supple, symmetrical, trachea midline, and thyroid  not enlarged, symmetric, no tenderness/mass/nodules Lungs: clear to auscultation bilaterally Breasts: normal appearance, no masses or tenderness Heart: regular rate and rhythm, S1, S2 normal, no murmur, click, rub or gallop Abdomen: soft, non-tender; bowel sounds normal; no masses,  no organomegaly Pelvic: cervix normal in appearance, external genitalia normal, no adnexal masses or tenderness, no cervical motion tenderness, uterus normal size, shape, and consistency, and vagina normal without discharge Extremities: extremities normal, atraumatic, no cyanosis or edema Pulses: 2+ and symmetric Skin: Skin color, texture, turgor normal. No rashes or lesions Lymph nodes: Cervical, supraclavicular, and axillary nodes normal. Neurologic: Grossly  normal    Assessment:    Healthy female exam.      Plan:   Problem List Items Addressed This Visit       Unprioritized   Perimenopause   99213 - Most pressing symptoms are hot flashes, night sweats and brain fog. Offered and declined Veozah. Has strong FH of breast cancer and declines HRT. Advised lifestyle of decreasing stress, self care, improved sleep, regular weight bearing exercise.       Other Visit Diagnoses       Screening for malignant neoplasm of cervix    -  Primary   Pap today     Encounter for gynecological examination without abnormal finding       annual labs. mammogram with [facility], due 11/25   Relevant Orders   Hemoglobin A1c   Lipid panel   CBC   TSH   Cytology - PAP         See After Visit Summary for Counseling Recommendations

## 2023-07-31 NOTE — Progress Notes (Deleted)
 Patient presents for Annual.  LMP: *** Last pap: 05/09/22 WNL  Contraception: {Blank single:19197::"OCP","POP","Nuvaring","Patch","Depo","Nexplanon","IUD: ***","Natural family planning","None","Not sexually active","Post-menopausal","Not indicated","***"} Mammogram: {Blank single:19197::"Up to date: ***","Not yet indicated","Due, last mammogram: ***"} STD Screening: {Blank single:19197::"Accepts","Declines","not indicated","***"}   CC: {Blank single:19197::"Annual/None","Annual and ***","***"}

## 2023-07-31 NOTE — Assessment & Plan Note (Addendum)
 [address]62 - Most pressing symptoms are hot flashes, night sweats and brain fog. Offered and declined Veozah. Has strong FH of breast cancer and declines HRT. Advised lifestyle of decreasing stress, self care, improved sleep, regular weight bearing exercise.

## 2023-08-01 ENCOUNTER — Ambulatory Visit: Payer: Self-pay | Admitting: Family Medicine

## 2023-08-01 LAB — LIPID PANEL
Chol/HDL Ratio: 3.7 ratio (ref 0.0–4.4)
Cholesterol, Total: 209 mg/dL — ABNORMAL HIGH (ref 100–199)
HDL: 57 mg/dL (ref >39)
LDL Chol Calc (NIH): 136 mg/dL — ABNORMAL HIGH (ref 0–99)
Triglycerides: 92 mg/dL (ref 0–149)
VLDL Cholesterol Cal: 16 mg/dL (ref 5–40)

## 2023-08-01 LAB — CBC
Hematocrit: 44.7 % (ref 34.0–46.6)
Hemoglobin: 14.6 g/dL (ref 11.1–15.9)
MCH: 31.3 pg (ref 26.6–33.0)
MCHC: 32.7 g/dL (ref 31.5–35.7)
MCV: 96 fL (ref 79–97)
Platelets: 210 10*3/uL (ref 150–450)
RBC: 4.66 x10E6/uL (ref 3.77–5.28)
RDW: 12.8 % (ref 11.7–15.4)
WBC: 5.8 10*3/uL (ref 3.4–10.8)

## 2023-08-01 LAB — TSH: TSH: 1.32 u[IU]/mL (ref 0.450–4.500)

## 2023-08-01 LAB — HEMOGLOBIN A1C
Est. average glucose Bld gHb Est-mCnc: 105 mg/dL
Hgb A1c MFr Bld: 5.3 % (ref 4.8–5.6)

## 2023-08-01 NOTE — Progress Notes (Signed)
 Message to [facility] with [facility] to add CMP to yesterday's lab work.

## 2023-08-06 LAB — CYTOLOGY - PAP
Comment: NEGATIVE
Diagnosis: NEGATIVE
Diagnosis: REACTIVE
High risk HPV: NEGATIVE

## 2023-08-23 LAB — COMPREHENSIVE METABOLIC PANEL WITH GFR
ALT: 14 IU/L (ref 0–32)
AST: 19 IU/L (ref 0–40)
Albumin: 4.8 g/dL (ref 3.9–4.9)
Alkaline Phosphatase: 108 IU/L (ref 44–121)
BUN/Creatinine Ratio: 25 — ABNORMAL HIGH (ref 9–23)
BUN: 17 mg/dL (ref 6–24)
Bilirubin Total: 0.7 mg/dL (ref 0.0–1.2)
CO2: 20 mmol/L (ref 20–29)
Calcium: 9.7 mg/dL (ref 8.7–10.2)
Chloride: 100 mmol/L (ref 96–106)
Creatinine, Ser: 0.68 mg/dL (ref 0.57–1.00)
Globulin, Total: 2.3 g/dL (ref 1.5–4.5)
Glucose: 92 mg/dL (ref 70–99)
Potassium: 4.3 mmol/L (ref 3.5–5.2)
Sodium: 138 mmol/L (ref 134–144)
Total Protein: 7.1 g/dL (ref 6.0–8.5)
eGFR: 107 mL/min/{1.73_m2} (ref >59)

## 2023-08-23 LAB — SPECIMEN STATUS REPORT

## 2023-09-23 DIAGNOSIS — C44311 Basal cell carcinoma of skin of nose: Secondary | ICD-10-CM | POA: Diagnosis not present

## 2023-09-23 DIAGNOSIS — Z85820 Personal history of malignant melanoma of skin: Secondary | ICD-10-CM | POA: Insufficient documentation

## 2023-09-23 DIAGNOSIS — D0371 Melanoma in situ of right lower limb, including hip: Secondary | ICD-10-CM | POA: Diagnosis not present

## 2023-10-17 ENCOUNTER — Ambulatory Visit: Admitting: Dermatology

## 2023-10-17 ENCOUNTER — Encounter: Payer: Self-pay | Admitting: Dermatology

## 2023-10-17 ENCOUNTER — Telehealth: Payer: Self-pay

## 2023-10-17 DIAGNOSIS — D225 Melanocytic nevi of trunk: Secondary | ICD-10-CM | POA: Diagnosis not present

## 2023-10-17 DIAGNOSIS — D2262 Melanocytic nevi of left upper limb, including shoulder: Secondary | ICD-10-CM

## 2023-10-17 DIAGNOSIS — Z86006 Personal history of melanoma in-situ: Secondary | ICD-10-CM

## 2023-10-17 DIAGNOSIS — Z1283 Encounter for screening for malignant neoplasm of skin: Secondary | ICD-10-CM | POA: Diagnosis not present

## 2023-10-17 DIAGNOSIS — L814 Other melanin hyperpigmentation: Secondary | ICD-10-CM

## 2023-10-17 DIAGNOSIS — Z86018 Personal history of other benign neoplasm: Secondary | ICD-10-CM

## 2023-10-17 DIAGNOSIS — D492 Neoplasm of unspecified behavior of bone, soft tissue, and skin: Secondary | ICD-10-CM | POA: Diagnosis not present

## 2023-10-17 DIAGNOSIS — D229 Melanocytic nevi, unspecified: Secondary | ICD-10-CM

## 2023-10-17 DIAGNOSIS — D2261 Melanocytic nevi of right upper limb, including shoulder: Secondary | ICD-10-CM | POA: Diagnosis not present

## 2023-10-17 DIAGNOSIS — D2271 Melanocytic nevi of right lower limb, including hip: Secondary | ICD-10-CM | POA: Diagnosis not present

## 2023-10-17 DIAGNOSIS — L821 Other seborrheic keratosis: Secondary | ICD-10-CM

## 2023-10-17 DIAGNOSIS — L578 Other skin changes due to chronic exposure to nonionizing radiation: Secondary | ICD-10-CM | POA: Diagnosis not present

## 2023-10-17 DIAGNOSIS — D1801 Hemangioma of skin and subcutaneous tissue: Secondary | ICD-10-CM

## 2023-10-17 DIAGNOSIS — L818 Other specified disorders of pigmentation: Secondary | ICD-10-CM | POA: Diagnosis not present

## 2023-10-17 NOTE — Patient Instructions (Signed)

## 2023-10-17 NOTE — Telephone Encounter (Signed)
 MOHs progress notes scanned into media of Malignant melanoma in situ of right anterior leg. aw

## 2023-10-17 NOTE — Progress Notes (Signed)
 Follow-Up Visit   Subjective  Tonya Leonard is a 48 y.o. female who presents for the following: Skin Cancer Screening and Full Body Skin Exam Hx of Melanoma IS, Hx of Dysplastic Nevi, hx of Aks, BCC awaiting mohs  check spots R thigh, yrs changes in color, R arm, R shoulder, L lower leg, L shoulder, L abdomen  The patient presents for Total-Body Skin Exam (TBSE) for skin cancer screening and mole check. The patient has spots, moles and lesions to be evaluated, some may be new or changing and the patient may have concern these could be cancer.    The following portions of the chart were reviewed this encounter and updated as appropriate: medications, allergies, medical history  Review of Systems:  No other skin or systemic complaints except as noted in HPI or Assessment and Plan.  Objective  Well appearing patient in no apparent distress; mood and affect are within normal limits.  A full examination was performed including scalp, head, eyes, ears, nose, lips, neck, chest, axillae, abdomen, back, buttocks, bilateral upper extremities, bilateral lower extremities, hands, feet, fingers, toes, fingernails, and toenails. All findings within normal limits unless otherwise noted below.   Relevant physical exam findings are noted in the Assessment and Plan.  R thigh above the knee Pigmented macule 4.58mm  R ant thigh Pigmented macule 4.11mm  R ant medial thigh 5.2mm pigmented macule  R sup shoulder Pigmented pap  4.39mm  R proximal forearm Pigmented macule 4.67mm  L ant lat lower leg 3.14mm pigmented macule  Left Shoulder - Anterior 4.58mm pigmented macule  LUQA 4.4mm pigmented macule   Assessment & Plan   SKIN CANCER SCREENING PERFORMED TODAY.  ACTINIC DAMAGE - Chronic condition, secondary to cumulative UV/sun exposure - diffuse scaly erythematous macules with underlying dyspigmentation - Recommend daily broad spectrum sunscreen SPF 30+ to sun-exposed areas, reapply every 2  hours as needed.  - Staying in the shade or wearing long sleeves, sun glasses (UVA+UVB protection) and wide brim hats (4-inch brim around the entire circumference of the hat) are also recommended for sun protection.  - Call for new or changing lesions.  LENTIGINES, SEBORRHEIC KERATOSES, HEMANGIOMAS - Benign normal skin lesions - Benign-appearing - Call for any changes  MELANOCYTIC NEVI - Tan-brown and/or pink-flesh-colored symmetric macules and papules - Benign appearing on exam today - Observation - Call clinic for new or changing moles - Recommend daily use of broad spectrum spf 30+ sunscreen to sun-exposed areas.   HISTORY OF MELANOMA IN SITU - No evidence of recurrence today - Recommend regular full body skin exams - Recommend daily broad spectrum sunscreen SPF 30+ to sun-exposed areas, reapply every 2 hours as needed.  - Call if any new or changing lesions are noted between office visits  - R upper pretibial Mohs 03/26/23 w/ Dr. Gregorio,  - R lower mid ant leg Atypical Intraepidermal Melanocytic Proliferation Mohs with Dr. Bluford 09/23/23   HISTORY OF DYSPLASTIC NEVUS No evidence of recurrence today Recommend regular full body skin exams Recommend daily broad spectrum sunscreen SPF 30+ to sun-exposed areas, reapply every 2 hours as needed.  Call if any new or changing lesions are noted between office visits  - L mid back paraspinal, L low back post waistline/sacral, R breast sup  BASAL CELL CARCINOMA Bx proven Nasal tip Exam: pink bx site nasal tip  Treatment Plan: Pt scheduled for mohs with Dr. Bluford 10/28/23   NEOPLASM OF SKIN (8) R thigh above the knee Skin / nail  biopsy Type of biopsy: tangential   Informed consent: discussed and consent obtained   Timeout: patient name, date of birth, surgical site, and procedure verified   Procedure prep:  Patient was prepped and draped in usual sterile fashion Prep type:  Isopropyl alcohol Anesthesia: the lesion was  anesthetized in a standard fashion   Anesthetic:  1% lidocaine  w/ epinephrine 1-100,000 buffered w/ 8.4% NaHCO3 Instrument used: DermaBlade   Hemostasis achieved with: pressure and aluminum chloride   Outcome: patient tolerated procedure well   Post-procedure details: sterile dressing applied and wound care instructions given   Dressing type: bandage and bacitracin    Specimen 1 - Surgical pathology Differential Diagnosis: Nevus vs Dysplastic Nevus  Check Margins: No Pigmented macule 4.67mm R ant thigh Skin / nail biopsy Type of biopsy: tangential   Informed consent: discussed and consent obtained   Timeout: patient name, date of birth, surgical site, and procedure verified   Procedure prep:  Patient was prepped and draped in usual sterile fashion Prep type:  Isopropyl alcohol Anesthesia: the lesion was anesthetized in a standard fashion   Anesthetic:  1% lidocaine  w/ epinephrine 1-100,000 buffered w/ 8.4% NaHCO3 Instrument used: DermaBlade   Hemostasis achieved with: pressure and aluminum chloride   Outcome: patient tolerated procedure well   Post-procedure details: sterile dressing applied and wound care instructions given   Dressing type: bandage and bacitracin    Specimen 2 - Surgical pathology Differential Diagnosis: Nevus vs Dysplastic Nevus  Check Margins: No Pigmented macule 4.62mm R ant medial thigh Skin / nail biopsy Type of biopsy: tangential   Informed consent: discussed and consent obtained   Timeout: patient name, date of birth, surgical site, and procedure verified   Procedure prep:  Patient was prepped and draped in usual sterile fashion Prep type:  Isopropyl alcohol Anesthesia: the lesion was anesthetized in a standard fashion   Anesthetic:  1% lidocaine  w/ epinephrine 1-100,000 buffered w/ 8.4% NaHCO3 Instrument used: DermaBlade   Hemostasis achieved with: pressure and aluminum chloride   Outcome: patient tolerated procedure well   Post-procedure details:  sterile dressing applied and wound care instructions given   Dressing type: bandage and bacitracin    Specimen 3 - Surgical pathology Differential Diagnosis: Nevus vs Dysplastic Nevus  Check Margins: No 5.41mm pigmented macule R sup shoulder Skin / nail biopsy Type of biopsy: tangential   Informed consent: discussed and consent obtained   Timeout: patient name, date of birth, surgical site, and procedure verified   Procedure prep:  Patient was prepped and draped in usual sterile fashion Prep type:  Isopropyl alcohol Anesthesia: the lesion was anesthetized in a standard fashion   Anesthetic:  1% lidocaine  w/ epinephrine 1-100,000 buffered w/ 8.4% NaHCO3 Instrument used: DermaBlade   Hemostasis achieved with: pressure and aluminum chloride   Outcome: patient tolerated procedure well   Post-procedure details: sterile dressing applied and wound care instructions given   Dressing type: bandage and bacitracin    Specimen 4 - Surgical pathology Differential Diagnosis: Nevus vs Dysplastic Nevus  Check Margins: No Pigmented pap  4.17mm  R proximal forearm Skin / nail biopsy Type of biopsy: tangential   Informed consent: discussed and consent obtained   Timeout: patient name, date of birth, surgical site, and procedure verified   Procedure prep:  Patient was prepped and draped in usual sterile fashion Prep type:  Isopropyl alcohol Anesthesia: the lesion was anesthetized in a standard fashion   Anesthetic:  1% lidocaine  w/ epinephrine 1-100,000 buffered w/ 8.4% NaHCO3  Instrument used: DermaBlade   Hemostasis achieved with: pressure and aluminum chloride   Outcome: patient tolerated procedure well   Post-procedure details: sterile dressing applied and wound care instructions given   Dressing type: bandage and bacitracin    Specimen 5 - Surgical pathology Differential Diagnosis: Nevus vs Dysplastic Nevus  Check Margins: No Pigmented macule 4.48mm L ant lat lower leg Skin / nail  biopsy Type of biopsy: tangential   Informed consent: discussed and consent obtained   Timeout: patient name, date of birth, surgical site, and procedure verified   Procedure prep:  Patient was prepped and draped in usual sterile fashion Prep type:  Isopropyl alcohol Anesthesia: the lesion was anesthetized in a standard fashion   Anesthetic:  1% lidocaine  w/ epinephrine 1-100,000 buffered w/ 8.4% NaHCO3 Instrument used: DermaBlade   Hemostasis achieved with: pressure and aluminum chloride   Outcome: patient tolerated procedure well   Post-procedure details: sterile dressing applied and wound care instructions given   Dressing type: bandage and bacitracin    Specimen 6 - Surgical pathology Differential Diagnosis: Nevus vs Dysplastic Nevus  Check Margins: No 3.6mm pigmented macule Left Shoulder - Anterior Skin / nail biopsy Type of biopsy: tangential   Informed consent: discussed and consent obtained   Timeout: patient name, date of birth, surgical site, and procedure verified   Procedure prep:  Patient was prepped and draped in usual sterile fashion Prep type:  Isopropyl alcohol Anesthesia: the lesion was anesthetized in a standard fashion   Anesthetic:  1% lidocaine  w/ epinephrine 1-100,000 buffered w/ 8.4% NaHCO3 Instrument used: DermaBlade   Hemostasis achieved with: pressure and aluminum chloride   Outcome: patient tolerated procedure well   Post-procedure details: sterile dressing applied and wound care instructions given   Dressing type: bandage and bacitracin    Specimen 7 - Surgical pathology Differential Diagnosis: Nevus vs Dysplastic Nevus  Check Margins: No 4.30mm pigmented macule LUQA Skin / nail biopsy Type of biopsy: tangential   Informed consent: discussed and consent obtained   Timeout: patient name, date of birth, surgical site, and procedure verified   Procedure prep:  Patient was prepped and draped in usual sterile fashion Prep type:  Isopropyl  alcohol Anesthesia: the lesion was anesthetized in a standard fashion   Anesthetic:  1% lidocaine  w/ epinephrine 1-100,000 buffered w/ 8.4% NaHCO3 Instrument used: DermaBlade   Hemostasis achieved with: pressure and aluminum chloride   Outcome: patient tolerated procedure well   Post-procedure details: sterile dressing applied and wound care instructions given   Dressing type: bandage and bacitracin    Specimen 8 - Surgical pathology Differential Diagnosis: Nevus vs Dysplastic Nevus  Check Margins: No 4.36mm pigmented macule MULTIPLE BENIGN NEVI   LENTIGINES   ACTINIC ELASTOSIS   SEBORRHEIC KERATOSES   CHERRY ANGIOMA   Return in about 3 months (around 01/17/2024) for TBSE, Hx of Melanoma IS, Hx of BCC, Hx of Dysplastic nevi, Hx of AKs surgery appt for possible bx .  I, Grayce Saunas, RMA, am acting as scribe for Boneta Sharps, MD .   Documentation: I have reviewed the above documentation for accuracy and completeness, and I agree with the above.  Boneta Sharps, MD

## 2023-10-23 ENCOUNTER — Ambulatory Visit: Payer: Self-pay | Admitting: Dermatology

## 2023-10-23 LAB — SURGICAL PATHOLOGY

## 2023-10-28 DIAGNOSIS — C44311 Basal cell carcinoma of skin of nose: Secondary | ICD-10-CM | POA: Diagnosis not present

## 2023-11-14 ENCOUNTER — Telehealth: Payer: Self-pay

## 2023-11-14 NOTE — Telephone Encounter (Signed)
 Updating Mohs, history/specimen tracking.

## 2023-12-25 DIAGNOSIS — C44311 Basal cell carcinoma of skin of nose: Secondary | ICD-10-CM | POA: Diagnosis not present

## 2024-01-15 ENCOUNTER — Encounter: Admitting: Dermatology

## 2024-01-15 ENCOUNTER — Telehealth: Payer: Self-pay | Admitting: Dermatology

## 2024-01-15 NOTE — Telephone Encounter (Signed)
 Spoke to patient by phone on speaker phone with Alan present. Patient shared experience of nasal tip BCC Mohs surgery. Patient shared [facility] was stage 2 and required 5 passes in Mohs. reconstruction is ongoing. Experience was traumatic for patient and family. Was out of work for 2 months as a result. Patient is disappointed that our office did not contact after Mohs surgery. Patient shared desire for biopsy sooner, how multiple treatments failed, and how outcome would have changed. Patient did google searches of BCC and melanoma and felt lesions were consistent with these diagnoses.

## 2024-01-15 NOTE — Telephone Encounter (Signed)
 shared that Tonya Leonard was present and patient was on speaker phone before discussion began

## 2024-01-21 ENCOUNTER — Ambulatory Visit: Payer: 59 | Admitting: Dermatology

## 2024-01-21 DIAGNOSIS — L905 Scar conditions and fibrosis of skin: Secondary | ICD-10-CM | POA: Diagnosis not present

## 2024-01-21 DIAGNOSIS — Z85820 Personal history of malignant melanoma of skin: Secondary | ICD-10-CM | POA: Diagnosis not present

## 2024-01-21 DIAGNOSIS — L570 Actinic keratosis: Secondary | ICD-10-CM | POA: Diagnosis not present

## 2024-01-21 DIAGNOSIS — Z85828 Personal history of other malignant neoplasm of skin: Secondary | ICD-10-CM | POA: Diagnosis not present

## 2024-01-21 DIAGNOSIS — D485 Neoplasm of uncertain behavior of skin: Secondary | ICD-10-CM | POA: Diagnosis not present

## 2024-01-21 DIAGNOSIS — Z1283 Encounter for screening for malignant neoplasm of skin: Secondary | ICD-10-CM | POA: Diagnosis not present

## 2024-01-21 DIAGNOSIS — D2271 Melanocytic nevi of right lower limb, including hip: Secondary | ICD-10-CM | POA: Diagnosis not present

## 2024-01-21 DIAGNOSIS — D225 Melanocytic nevi of trunk: Secondary | ICD-10-CM | POA: Diagnosis not present

## 2024-01-21 DIAGNOSIS — L812 Freckles: Secondary | ICD-10-CM | POA: Diagnosis not present

## 2024-02-10 ENCOUNTER — Other Ambulatory Visit: Payer: Self-pay

## 2024-02-10 DIAGNOSIS — B001 Herpesviral vesicular dermatitis: Secondary | ICD-10-CM

## 2024-02-10 MED ORDER — VALACYCLOVIR HCL 1 G PO TABS: 1000.0000 mg | ORAL_TABLET | Freq: Two times a day (BID) | ORAL | 0 refills | Status: AC
